# Patient Record
Sex: Female | Born: 1960 | Hispanic: No | Marital: Married | Smoking: Never smoker
Health system: Southern US, Community
[De-identification: ages and names within clinical notes are randomized; demographics above are authoritative.]

## PROBLEM LIST (undated history)

## (undated) DIAGNOSIS — D649 Anemia, unspecified: Secondary | ICD-10-CM

## (undated) DIAGNOSIS — K219 Gastro-esophageal reflux disease without esophagitis: Secondary | ICD-10-CM

## (undated) DIAGNOSIS — B029 Zoster without complications: Secondary | ICD-10-CM

## (undated) DIAGNOSIS — G4730 Sleep apnea, unspecified: Secondary | ICD-10-CM

## (undated) HISTORY — PX: TUBAL LIGATION: SHX77

## (undated) HISTORY — PX: NASAL SINUS SURGERY: SHX719

## (undated) HISTORY — PX: WISDOM TOOTH EXTRACTION: SHX21

---

## 2015-09-23 ENCOUNTER — Other Ambulatory Visit (HOSPITAL_COMMUNITY)
Admission: RE | Admit: 2015-09-23 | Discharge: 2015-09-23 | Disposition: A | Discharge status: Home / Self Care | Payer: Self-pay | Source: Ambulatory Visit | Attending: Family Medicine | Admitting: Family Medicine

## 2015-09-23 ENCOUNTER — Other Ambulatory Visit: Payer: Self-pay

## 2015-09-23 DIAGNOSIS — Z1231 Encounter for screening mammogram for malignant neoplasm of breast: Secondary | ICD-10-CM

## 2015-09-23 DIAGNOSIS — Z124 Encounter for screening for malignant neoplasm of cervix: Secondary | ICD-10-CM | POA: Insufficient documentation

## 2015-10-22 ENCOUNTER — Ambulatory Visit
Admission: RE | Admit: 2015-10-22 | Discharge: 2015-10-22 | Disposition: A | Discharge status: Home / Self Care | Payer: Managed Care, Other (non HMO) | Source: Ambulatory Visit

## 2015-10-22 DIAGNOSIS — Z1231 Encounter for screening mammogram for malignant neoplasm of breast: Secondary | ICD-10-CM

## 2015-12-01 ENCOUNTER — Encounter (HOSPITAL_COMMUNITY): Payer: Self-pay | Admitting: *Deleted

## 2016-01-11 ENCOUNTER — Other Ambulatory Visit (HOSPITAL_COMMUNITY): Payer: Self-pay | Admitting: Obstetrics and Gynecology

## 2016-01-11 NOTE — Anesthesia Preprocedure Evaluation (Signed)
Anesthesia Evaluation  Patient identified by MRN, date of birth, ID band Patient awake    Reviewed: Allergy & Precautions, NPO status , Patient's Chart, lab work & pertinent test results  Airway Mallampati: II  TM Distance: <3 FB Neck ROM: Full    Dental no notable dental hx.    Pulmonary sleep apnea ,    Pulmonary exam normal breath sounds clear to auscultation       Cardiovascular negative cardio ROS Normal cardiovascular exam Rhythm:Regular Rate:Normal     Neuro/Psych negative neurological ROS  negative psych ROS   GI/Hepatic Neg liver ROS, GERD  Medicated,  Endo/Other  negative endocrine ROS  Renal/GU negative Renal ROS  negative genitourinary   Musculoskeletal negative musculoskeletal ROS (+)   Abdominal   Peds negative pediatric ROS (+)  Hematology negative hematology ROS (+)   Anesthesia Other Findings   Reproductive/Obstetrics negative OB ROS                             Anesthesia Physical Anesthesia Plan  ASA: II  Anesthesia Plan: General   Post-op Pain Management:    Induction: Intravenous  Airway Management Planned: Oral ETT  Additional Equipment:   Intra-op Plan:   Post-operative Plan: Extubation in OR  Informed Consent: I have reviewed the patients History and Physical, chart, labs and discussed the procedure including the risks, benefits and alternatives for the proposed anesthesia with the patient or authorized representative who has indicated his/her understanding and acceptance.   Dental advisory given  Plan Discussed with: CRNA and Surgeon  Anesthesia Plan Comments:         Anesthesia Quick Evaluation

## 2016-01-11 NOTE — H&P (Signed)
Chief Complaint(s):   Preop history and physical for 01/12/16   HPI:  General 55 y/o presents history and physical in preparation for hysteroscopy D&C possible polypectomy scheduled for 01/12/2016. she started seeing spotting the first of January. she has spotting once a week. she has had postcoital bleeding since january as well. she denies pelvic pain. she has been postmenopausal since the age of 53.  Her ultrasound performed 10/19/2015 shows a 6.8 cm x 4.3 cm x 3.2 cm uterus. The endometrium is fluid filled with hyperechoic mass on the anterior wall 1.5 cm. no blood flow is noted. she has 3 small fibroids the largest is 1.2 cm. Her ovaries appear normal bilaterally.  Current Medication:  Taking  Multivitamin . Tablet 1 tablet by mouth Once daily     Medication List reviewed and reconciled with the patient   Medical History:   postmenopausal bleeding      Allergies/Intolerance:   N.K.D.A.   Gyn History:   Sexual activity currently sexually active. Periods : postmenopausal. LMP 2012. Birth control BTL. Last pap smear date 09/22/15. Last mammogram date 10/22/15. H/O Abnormal pap smear f/u normal. Denies STD.   OB History:   Number of pregnancies 1. Pregnancy # 1 live birth, vaginal delivery, girl.   Surgical History:   deviated septum 1990     BTL 1988   Hospitalization:   child birth x 1   Family History:   Father: deceased, complications of surgery    Mother: alive, OA    2 brother(s) , 1 sister(s) - healthy. 1daughter(s) .    denies family h/o gyn cancers.  Social History:  General Tobacco use cigarettes: Never smoked, Tobacco history last updated 01/04/2016.  Alcohol: yes, occasionally, wine.  Caffeine: yes, 2+ servings daily, soda.  no Exercise, nothing structured.  Marital Status: married, Separated.  EDUCATION: HS.  OCCUPATION: employed, [employer]astics, 3rd shift.  COMMUNICATION BARRIERS: none.  ROS: CONSTITUTIONAL none" options="no,yes" propid="91" itemid="[id number]"  categoryid="10464" encounterid="[id number]"Fatigue none. none today" options="no,yes" propid="91" itemid="10467" categoryid="10464" encounterid="[id number]"Fever none today.  CARDIOLOGY none" options="no,yes" propid="91" itemid="[id number]" categoryid="10488" encounterid="[id number]"Chest pain none.  RESPIRATORY no" options="no" propid="91" itemid="270013" categoryid="138132" encounterid="[id number]"Shortness of breath no. no" options="no,yes" propid="91" itemid="172745" categoryid="138132" encounterid="[id number]"Cough no.  GASTROENTEROLOGY none" options="no,yes" propid="91" itemid="193447" categoryid="10494" encounterid="[id number]"Appetite change none. no" options="no,yes" propid="91" itemid="193449" categoryid="10494" encounterid="[id number]"Change in bowel habits no.  FEMALE REPRODUCTIVE no" options="no,yes" propid="91" itemid="196298" categoryid="10525" encounterid="[id number]"Breast lumps or discharge no. none" options="no,yes" propid="91" itemid="186083" categoryid="10525" encounterid="[id number]"Breast pain none. none" options="no,yes" propid="91" itemid="138198" categoryid="10525" encounterid="[id number]"Dyspareunia none. no" options="no,yes" propid="91" itemid="202654" categoryid="10525" encounterid="[id number]"Dysuria no. none" options="no,yes" propid="91" itemid="186082" categoryid="10525" encounterid="[id number]"Pelvic pain none. NA" options="no,yes" propid="91" itemid="199173" categoryid="10525" encounterid="[id number]"Regular menses NA. no" options="no,yes" propid="91" itemid="278230" categoryid="10525" encounterid="[id number]"Unusual vaginal discharge no. no" options="no,yes" propid="91" itemid="278942" categoryid="10525" encounterid="[id number]"Vaginal itching no. no" options="no,yes" propid="91" itemid="278837" categoryid="10525" encounterid="[id number]"Vulvar/labial lesion no.  NEUROLOGY none" options="no,yes" propid="91" itemid="193627" categoryid="12512" encounterid="[id number]"Migraines none. none" options="no,yes" propid="91"  itemid="12514" categoryid="12512" encounterid="[id number]"Tingling/numbness none. none" options="no,yes" propid="91" itemid="193467" categoryid="12512" encounterid="[id number]"Visual changes none.  PSYCHOLOGY no" options="" propid="91" itemid="275919" categoryid="10520" encounterid="[id number]"Depression no.  SKIN no" options="no,yes" propid="91" itemid="269383" categoryid="202750" encounterid="[id number]"Rash no. no" options="no,yes" propid="91" itemid="202757" categoryid="202750" encounterid="[id number]"Suspicious lesions no.  ENDOCRINOLOGY none" options="no,yes" propid="91" itemid="202624" categoryid="12508" encounterid="[id number]"Hot flashes none. no unintentional" options="no,yes" propid="91" itemid="193436" categoryid="12508" encounterid="[id number]"Weight gain no unintentional. none" options="no,yes" propid="91" itemid="138164" categoryid="12508" encounterid="[id number]"Weight loss none.  HEMATOLOGY/LYMPH no" options="no,yes" propid="91" itemid="193454" categoryid="138157" encounterid="[id number]"Anemia no.    Objective: Vitals:  Wt 215, Wt change -5 lb, Pulse sitting 62, BP sitting 133/71  Past Results:  Examination:  General Examination McCoy,Tiffany 01/04/2016 09:44:16 AM &gt; , for pelvic exam only" categoryPropId="[id number]" examid="[id number]"CHAPERONE PRESENT McCoy,Tiffany 01/04/2016 09:44:16  AM > , for pelvic exam only.  Physical Examination: GENERAL in NAD, pleasant"Patient appears in NAD, pleasant. well developed"Build: well developed. overweight"General Appearance: overweight. african-american"Race: african-american.  NECK unremarkable, no lymphadenopathy"Cervical lymph nodes: unremarkable, no lymphadenopathy. normal"ROM: normal. no thyromegaly, non tender"Thyroid: no thyromegaly, non tender.  LUNGS clear to auscultation"Breath sounds: clear to auscultation. no"Dyspnea: no.  HEART none"Murmurs: none. normal"Rate: normal. regular"Rhythm: regular.  ABDOMEN no masses,tenderness,organomegaly, no CVAT"General:  no masses,tenderness,organomegaly, no CVAT.  FEMALE GENITOURINARY no mass, non tender"Adnexa: no mass, non tender. normal, no lesions"Anus/perineum: normal, no lesions. normal appearance , no lesions/discharge/bleeding,, good pelvic support , external os normal "Cervix/ cuff: normal appearance , no lesions/discharge/bleeding,, good pelvic support , external os normal . normal, no lesions, no skin discoloration, no lymphadenopathy"External genitalia: normal, no lesions, no skin discoloration, no lymphadenopathy. deferred"Rectum: deferred. normal external meatus"Urethra: normal external meatus. normal size/shape/consistency, freely mobile, non tender"Uterus: normal size/shape/consistency, freely mobile, non tender. pink/moist mucosa, no lesions, no abnormal discharge, odorless"Vagina: pink/moist mucosa, no lesions, no abnormal discharge, odorless. normal, no lesions, no skin discoloration, non tender"Vulva: normal, no lesions, no skin discoloration, non tender.  EXTREMITIES FROM of all extremities"Extremities FROM of all extremities.  NEUROLOGICAL normal"Gait: normal. alert and oriented x 3"Orientation: alert and oriented x 3.    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 Marland Kitchen rb/a of surgery discussed with the patient including but not limited to infection bleeding perforation of the uterus with the need for further surgery. pt voiced understanding and desires to proceed.  Cervical stenosis (uterine cervix)  Notes: pt to place cytotec 200 mcg per vagina 12 hours and 6 hours prior to procedure.. medication given to pt in the office.  Endometrial mass  Notes: hystereoscopy D&C possible polypectomy. Marland Kitchen rb/a of surgery discussed with the patient including but  not limited to infection bleeding perforation of the uterus with the need for further surgery. pt voiced understanding and desires to proceed

## 2016-01-12 ENCOUNTER — Encounter (HOSPITAL_COMMUNITY): Payer: Self-pay | Admitting: *Deleted

## 2016-01-12 ENCOUNTER — Encounter (HOSPITAL_COMMUNITY)
Admission: RE | Disposition: A | Discharge status: Home / Self Care | Payer: Self-pay | Source: Ambulatory Visit | Attending: Obstetrics and Gynecology

## 2016-01-12 ENCOUNTER — Ambulatory Visit (HOSPITAL_COMMUNITY)
Admission: RE | Admit: 2016-01-12 | Discharge: 2016-01-12 | Disposition: A | Discharge status: Home / Self Care | Payer: 59 | Source: Ambulatory Visit | Attending: Obstetrics and Gynecology | Admitting: Obstetrics and Gynecology

## 2016-01-12 ENCOUNTER — Ambulatory Visit (HOSPITAL_COMMUNITY): Payer: 59 | Admitting: Anesthesiology

## 2016-01-12 DIAGNOSIS — Z79899 Other long term (current) drug therapy: Secondary | ICD-10-CM | POA: Insufficient documentation

## 2016-01-12 DIAGNOSIS — N8502 Endometrial intraepithelial neoplasia [EIN]: Secondary | ICD-10-CM | POA: Diagnosis not present

## 2016-01-12 DIAGNOSIS — Z8261 Family history of arthritis: Secondary | ICD-10-CM | POA: Insufficient documentation

## 2016-01-12 DIAGNOSIS — N882 Stricture and stenosis of cervix uteri: Secondary | ICD-10-CM | POA: Diagnosis present

## 2016-01-12 DIAGNOSIS — N9489 Other specified conditions associated with female genital organs and menstrual cycle: Secondary | ICD-10-CM | POA: Diagnosis present

## 2016-01-12 DIAGNOSIS — N950 Postmenopausal bleeding: Secondary | ICD-10-CM | POA: Diagnosis present

## 2016-01-12 HISTORY — PX: DILATATION & CURETTAGE/HYSTEROSCOPY WITH MYOSURE: SHX6511

## 2016-01-12 HISTORY — DX: Gastro-esophageal reflux disease without esophagitis: K21.9

## 2016-01-12 HISTORY — DX: Anemia, unspecified: D64.9

## 2016-01-12 HISTORY — DX: Zoster without complications: B02.9

## 2016-01-12 HISTORY — DX: Sleep apnea, unspecified: G47.30

## 2016-01-12 LAB — CBC
HCT: 37.7 % (ref 36.0–46.0)
HEMOGLOBIN: 12.4 g/dL (ref 12.0–15.0)
MCH: 25.5 pg — AB (ref 26.0–34.0)
MCHC: 32.9 g/dL (ref 30.0–36.0)
MCV: 77.6 fL — AB (ref 78.0–100.0)
PLATELETS: 305 10*3/uL (ref 150–400)
RBC: 4.86 MIL/uL (ref 3.87–5.11)
RDW: 14.7 % (ref 11.5–15.5)
WBC: 4.5 10*3/uL (ref 4.0–10.5)

## 2016-01-12 LAB — TYPE AND SCREEN
ABO/RH(D): A POS
Antibody Screen: NEGATIVE

## 2016-01-12 LAB — ABO/RH: ABO/RH(D): A POS

## 2016-01-12 SURGERY — DILATATION & CURETTAGE/HYSTEROSCOPY WITH MYOSURE
Anesthesia: General | Site: Vagina

## 2016-01-12 MED ORDER — ROCURONIUM BROMIDE 100 MG/10ML IV SOLN
INTRAVENOUS | Status: AC
  Filled 2016-01-12: qty 1

## 2016-01-12 MED ORDER — FENTANYL CITRATE (PF) 100 MCG/2ML IJ SOLN
INTRAMUSCULAR | Status: DC | PRN
  Administered 2016-01-12: 50 ug via INTRAVENOUS

## 2016-01-12 MED ORDER — PROPOFOL 10 MG/ML IV BOLUS
INTRAVENOUS | Status: DC | PRN
  Administered 2016-01-12: 170 mg via INTRAVENOUS

## 2016-01-12 MED ORDER — KETOROLAC TROMETHAMINE 30 MG/ML IJ SOLN
INTRAMUSCULAR | Status: AC
  Filled 2016-01-12: qty 1

## 2016-01-12 MED ORDER — DEXAMETHASONE SODIUM PHOSPHATE 10 MG/ML IJ SOLN
INTRAMUSCULAR | Status: DC | PRN
  Administered 2016-01-12: 4 mg via INTRAVENOUS

## 2016-01-12 MED ORDER — ONDANSETRON HCL 4 MG/2ML IJ SOLN
INTRAMUSCULAR | Status: DC | PRN
  Administered 2016-01-12: 4 mg via INTRAVENOUS

## 2016-01-12 MED ORDER — KETOROLAC TROMETHAMINE 30 MG/ML IJ SOLN
INTRAMUSCULAR | Status: DC | PRN
  Administered 2016-01-12: 30 mg via INTRAVENOUS

## 2016-01-12 MED ORDER — PROPOFOL 10 MG/ML IV BOLUS
INTRAVENOUS | Status: AC
  Filled 2016-01-12: qty 20

## 2016-01-12 MED ORDER — SCOPOLAMINE 1 MG/3DAYS TD PT72
MEDICATED_PATCH | TRANSDERMAL | Status: AC
  Administered 2016-01-12: 1.5 mg via TRANSDERMAL
  Filled 2016-01-12: qty 1

## 2016-01-12 MED ORDER — EPHEDRINE SULFATE 50 MG/ML IJ SOLN
INTRAMUSCULAR | Status: DC | PRN
  Administered 2016-01-12: 5 mg via INTRAVENOUS

## 2016-01-12 MED ORDER — DEXAMETHASONE SODIUM PHOSPHATE 4 MG/ML IJ SOLN
INTRAMUSCULAR | Status: AC
  Filled 2016-01-12: qty 1

## 2016-01-12 MED ORDER — EPHEDRINE 5 MG/ML INJ
INTRAVENOUS | Status: AC
  Filled 2016-01-12: qty 10

## 2016-01-12 MED ORDER — LACTATED RINGERS IV SOLN
INTRAVENOUS | Status: DC
  Administered 2016-01-12 (×2): via INTRAVENOUS

## 2016-01-12 MED ORDER — METOCLOPRAMIDE HCL 5 MG/ML IJ SOLN: 10.0000 mg | Freq: Once | INTRAMUSCULAR | Status: DC | PRN

## 2016-01-12 MED ORDER — HYDROMORPHONE HCL 1 MG/ML IJ SOLN: 0.2500 mg | INTRAMUSCULAR | Status: DC | PRN

## 2016-01-12 MED ORDER — IBUPROFEN 600 MG PO TABS: ORAL_TABLET | ORAL | Status: AC

## 2016-01-12 MED ORDER — ONDANSETRON HCL 4 MG/2ML IJ SOLN
INTRAMUSCULAR | Status: AC
  Filled 2016-01-12: qty 2

## 2016-01-12 MED ORDER — LIDOCAINE HCL (CARDIAC) 20 MG/ML IV SOLN
INTRAVENOUS | Status: AC
  Filled 2016-01-12: qty 5

## 2016-01-12 MED ORDER — LIDOCAINE HCL (CARDIAC) 20 MG/ML IV SOLN
INTRAVENOUS | Status: DC | PRN
  Administered 2016-01-12: 100 mg via INTRAVENOUS

## 2016-01-12 MED ORDER — SCOPOLAMINE 1 MG/3DAYS TD PT72
1.0000 | MEDICATED_PATCH | Freq: Once | TRANSDERMAL | Status: DC
  Administered 2016-01-12: 1.5 mg via TRANSDERMAL

## 2016-01-12 MED ORDER — BUPIVACAINE HCL (PF) 0.25 % IJ SOLN
INTRAMUSCULAR | Status: DC | PRN
  Administered 2016-01-12: 20 mL

## 2016-01-12 MED ORDER — MIDAZOLAM HCL 2 MG/2ML IJ SOLN
INTRAMUSCULAR | Status: DC | PRN
  Administered 2016-01-12: 2 mg via INTRAVENOUS

## 2016-01-12 MED ORDER — MIDAZOLAM HCL 2 MG/2ML IJ SOLN
INTRAMUSCULAR | Status: AC
  Filled 2016-01-12: qty 2

## 2016-01-12 MED ORDER — OXYCODONE-ACETAMINOPHEN 5-325 MG PO TABS: 1.0000 | ORAL_TABLET | ORAL | Status: DC | PRN

## 2016-01-12 MED ORDER — BUPIVACAINE HCL (PF) 0.25 % IJ SOLN
INTRAMUSCULAR | Status: AC
  Filled 2016-01-12: qty 30

## 2016-01-12 MED ORDER — FENTANYL CITRATE (PF) 250 MCG/5ML IJ SOLN
INTRAMUSCULAR | Status: AC
  Filled 2016-01-12: qty 5

## 2016-01-12 SURGICAL SUPPLY — 23 items
CANISTER SUCT 3000ML (MISCELLANEOUS) ×3 IMPLANT
CATH ROBINSON RED A/P 16FR (CATHETERS) ×3 IMPLANT
CLOTH BEACON ORANGE TIMEOUT ST (SAFETY) ×3 IMPLANT
CONTAINER PREFILL 10% NBF 60ML (FORM) ×3 IMPLANT
DEVICE MYOSURE LITE (MISCELLANEOUS) ×3 IMPLANT
DEVICE MYOSURE REACH (MISCELLANEOUS) IMPLANT
DILATOR CANAL MILEX (MISCELLANEOUS) ×3 IMPLANT
ELECT REM PT RETURN 9FT ADLT (ELECTROSURGICAL) ×3
ELECTRODE REM PT RTRN 9FT ADLT (ELECTROSURGICAL) ×1 IMPLANT
FILTER ARTHROSCOPY CONVERTOR (FILTER) ×3 IMPLANT
GLOVE BIOGEL M 6.5 STRL (GLOVE) ×6 IMPLANT
GLOVE BIOGEL PI IND STRL 6.5 (GLOVE) ×1 IMPLANT
GLOVE BIOGEL PI IND STRL 7.0 (GLOVE) ×1 IMPLANT
GLOVE BIOGEL PI INDICATOR 6.5 (GLOVE) ×2
GLOVE BIOGEL PI INDICATOR 7.0 (GLOVE) ×2
GOWN STRL REUS W/TWL LRG LVL3 (GOWN DISPOSABLE) ×6 IMPLANT
PACK VAGINAL MINOR WOMEN LF (CUSTOM PROCEDURE TRAY) ×3 IMPLANT
PAD OB MATERNITY 4.3X12.25 (PERSONAL CARE ITEMS) ×3 IMPLANT
SEAL ROD LENS SCOPE MYOSURE (ABLATOR) ×3 IMPLANT
TOWEL OR 17X24 6PK STRL BLUE (TOWEL DISPOSABLE) ×6 IMPLANT
TUBING AQUILEX INFLOW (TUBING) ×3 IMPLANT
TUBING AQUILEX OUTFLOW (TUBING) ×3 IMPLANT
WATER STERILE IRR 1000ML POUR (IV SOLUTION) ×3 IMPLANT

## 2016-01-12 NOTE — H&P (Signed)
Date of Initial H&P: 01/11/2016  History reviewed, patient examined, no change in status, stable for surgery.

## 2016-01-12 NOTE — Transfer of Care (Signed)
Immediate Anesthesia Transfer of Care Note  Patient: Ebony Nichols  Procedure(s) Performed: Procedure(s): DILATATION & CURETTAGE/HYSTEROSCOPY WITH MYOSURE POLYPECTOMY (N/A)  Patient Location: PACU  Anesthesia Type:General  Level of Consciousness: awake, alert  and oriented  Airway & Oxygen Therapy: Patient Spontanous Breathing and Patient connected to nasal cannula oxygen  Post-op Assessment: Report given to RN, Post -op Vital signs reviewed and stable and Patient moving all extremities  Post vital signs: Reviewed and stable  Last Vitals:  Filed Vitals:   01/12/16 0618  BP: 116/58  Pulse: 74  Temp: 36.8 C  Resp: 18    Last Pain: There were no vitals filed for this visit.    Patients Stated Pain Goal: 4 ([id number])  Complications: No apparent anesthesia complications

## 2016-01-12 NOTE — Anesthesia Procedure Notes (Signed)
Procedure Name: LMA Insertion Date/Time: 01/12/2016 7:29 AM Performed by: Hewitt Blade Pre-anesthesia Checklist: Patient identified, Emergency Drugs available, Suction available and Patient being monitored Patient Re-evaluated:Patient Re-evaluated prior to inductionOxygen Delivery Method: Circle system utilized Preoxygenation: Pre-oxygenation with 100% oxygen Intubation Type: IV induction LMA: LMA inserted LMA Size: 4.0 Placement Confirmation: positive ETCO2 and breath sounds checked- equal and bilateral Tube secured with: Tape Dental Injury: Teeth and Oropharynx as per pre-operative assessment

## 2016-01-12 NOTE — Discharge Instructions (Signed)

## 2016-01-12 NOTE — Anesthesia Postprocedure Evaluation (Signed)
Anesthesia Post Note  Patient: Ebony Nichols  Procedure(s) Performed: Procedure(s) (LRB): DILATATION & CURETTAGE/HYSTEROSCOPY WITH MYOSURE POLYPECTOMY (N/A)  Patient location during evaluation: PACU Anesthesia Type: General Level of consciousness: awake and alert Pain management: pain level controlled Vital Signs Assessment: post-procedure vital signs reviewed and stable Respiratory status: spontaneous breathing, nonlabored ventilation, respiratory function stable and patient connected to nasal cannula oxygen Cardiovascular status: blood pressure returned to baseline and stable Postop Assessment: no signs of nausea or vomiting Anesthetic complications: no    Last Vitals:  Filed Vitals:   01/12/16 0618  BP: 116/58  Pulse: 74  Temp: 36.8 C  Resp: 18    Last Pain: There were no vitals filed for this visit.               Aubriana Ravelo S

## 2016-01-12 NOTE — Op Note (Signed)
01/12/2016  8:26 AM  PATIENT:  Ebony Nichols  55 y.o. female  PRE-OPERATIVE DIAGNOSIS:  Endometrial Mass POST MENOPAUSAL BLEEDING   POST-OPERATIVE DIAGNOSIS:  Endometrial Mass POST MENOPAUSAL BLEEDING   PROCEDURE:  Procedure(s): DILATATION & CURETTAGE/HYSTEROSCOPY WITH MYOSURE POLYPECTOMY (N/A)  SURGEON:  Surgeon(s) and Role:    * Christophe Louis, MD - Primary  PHYSICIAN ASSISTANT: None  ASSISTANTS: none   ANESTHESIA:   general  EBL:  5 cc  Total I/O In: 1000 [I.V.:1000] Out: 5 [Blood:5]  BLOOD ADMINISTERED:none  DRAINS: none   LOCAL MEDICATIONS USED:  MARCAINE     SPECIMEN:  Source of Specimen:  endometrial currettings and possible polyp  DISPOSITION OF SPECIMEN:  PATHOLOGY  COUNTS:  YES  TOURNIQUET:  * No tourniquets in log *  DICTATION: .Dragon Dictation  PLAN OF CARE: Discharge to home after PACU  PATIENT DISPOSITION:  PACU - hemodynamically stable.   Delay start of Pharmacological VTE agent (>24hrs) due to surgical blood loss or risk of bleeding: not applicable  Findings: stenotic cervix... Small polypoid tissue in endometrial cavity.. The majority of the endometrium appeared inactive.  Procedure: Patient was taken to the operating room where she was placed under general anesthesia. She was placed in the dorsal lithotomy position. She was prepped and draped in the usual sterile fashion. Time out was performed.   A speculum was placed into the vaginal vault. The anterior lip of the cervix was grasped with a single-tooth tenaculum. Quarter percent Marcaine (10cc)  was injected at the 4 and 8:00 positions of the cervix. The cervix was then sounded to 7 cm. The cervix was dilated to approximately 6 mm. Myosure hysteroscope was inserted. The findings noted above. Myosure lite blade was inserted and the polypoid tissue was removed without difficulty.Hervey Ard curet was introduced and endometrial corrected curettings were obtained. The hysteroscope was then reinserted. There  was no evidence of endometrial polyps or masses with reinsertion of the hysteroscope. There was no evidence of perforation. Hysteroscope was then removed. The single-tooth tenaculum was removed from the anterior lip of the cervix. Excellent hemostasis was noted  . The speculum was removed from the patient's vagina. She was awakened from anesthesia taken to the recovery  room awake and in stable condition. Sponge lap and needle counts were correct x2.

## 2016-01-13 ENCOUNTER — Encounter (HOSPITAL_COMMUNITY): Payer: Self-pay | Admitting: Obstetrics and Gynecology

## 2016-02-16 ENCOUNTER — Encounter: Payer: Self-pay | Admitting: Gynecologic Oncology

## 2016-02-16 ENCOUNTER — Ambulatory Visit: Payer: 59 | Attending: Gynecologic Oncology | Admitting: Gynecologic Oncology

## 2016-02-16 VITALS — BP 128/85 | HR 89 | Temp 97.9°F | Resp 19 | Ht 65.0 in | Wt 212.0 lb

## 2016-02-16 DIAGNOSIS — Z8619 Personal history of other infectious and parasitic diseases: Secondary | ICD-10-CM | POA: Insufficient documentation

## 2016-02-16 DIAGNOSIS — K219 Gastro-esophageal reflux disease without esophagitis: Secondary | ICD-10-CM | POA: Diagnosis not present

## 2016-02-16 DIAGNOSIS — Z90710 Acquired absence of both cervix and uterus: Secondary | ICD-10-CM | POA: Insufficient documentation

## 2016-02-16 DIAGNOSIS — Z90722 Acquired absence of ovaries, bilateral: Secondary | ICD-10-CM | POA: Diagnosis not present

## 2016-02-16 DIAGNOSIS — N8502 Endometrial intraepithelial neoplasia [EIN]: Secondary | ICD-10-CM | POA: Insufficient documentation

## 2016-02-16 DIAGNOSIS — D649 Anemia, unspecified: Secondary | ICD-10-CM | POA: Diagnosis not present

## 2016-02-16 DIAGNOSIS — G4730 Sleep apnea, unspecified: Secondary | ICD-10-CM | POA: Insufficient documentation

## 2016-02-16 NOTE — Patient Instructions (Addendum)
Preparing for your Surgery  Plan for surgery including a robotic assisted total hysterectomy, bilateral salpingo-oophorectomy, possible lymphadenectomy.  Call our office when you have decided on a time when you would like to have surgery.  Pre-operative Testing -You will receive a phone call from presurgical testing at [facility] to arrange for a pre-operative testing appointment before your surgery.  This appointment normally occurs one to two weeks before your scheduled surgery.   -Bring your insurance card, copy of an advanced directive if applicable, medication list  -At that visit, you will be asked to sign a consent for a possible blood transfusion in case a transfusion becomes necessary during surgery.  The need for a blood transfusion is rare but having consent is a necessary part of your care.     -You should not be taking blood thinners or aspirin at least ten days prior to surgery unless instructed by your surgeon.  Day Before Surgery at Man will be asked to take in a light diet the day before surgery.  Avoid carbonated beverages.  You will be advised to have nothing to eat or drink after midnight the evening before.     Eat a light diet the day before surgery.  Examples including soups, broths, toast, yogurt, mashed potatoes.  Things to avoid include carbonated beverages (fizzy beverages), raw fruits and raw vegetables, or beans.    If your bowels are filled with gas, your surgeon will have difficulty visualizing your pelvic organs which increases your surgical risks.  Your role in recovery Your role is to become active as soon as directed by your doctor, while still giving yourself time to heal.  Rest when you feel tired. You will be asked to do the following in order to speed your recovery:  - Cough and breathe deeply. This helps toclear and expand your lungs and can prevent pneumonia. You may be given a spirometer to practice deep breathing. A staff  member will show you how to use the spirometer. - Do mild physical activity. Walking or moving your legs help your circulation and body functions return to normal. A staff member will help you when you try to walk and will provide you with simple exercises. Do not try to get up or walk alone the first time. - Actively manage your pain. Managing your pain lets you move in comfort. We will ask you to rate your pain on a scale of zero to 10. It is your responsibility to tell your doctor or nurse where and how much you hurt so your pain can be treated.  Special Considerations -If you are diabetic, you may be placed on insulin after surgery to have closer control over your blood sugars to promote healing and recovery.  This does not mean that you will be discharged on insulin.  If applicable, your oral antidiabetics will be resumed when you are tolerating a solid diet.  -Your final pathology results from surgery should be available by the Friday after surgery and the results will be relayed to you when available.  Blood Transfusion Information WHAT IS A BLOOD TRANSFUSION? A transfusion is the replacement of blood or some of its parts. Blood is made up of multiple cells which provide different functions.  Red blood cells carry oxygen and are used for blood loss replacement.  White blood cells fight against infection.  Platelets control bleeding.  Plasma helps clot blood.  Other blood products are available for specialized needs, such as hemophilia or other  clotting disorders. BEFORE THE TRANSFUSION  Who gives blood for transfusions?   You may be able to donate blood to be used at a later date on yourself (autologous donation).  Relatives can be asked to donate blood. This is generally not any safer than if you have received blood from a stranger. The same precautions are taken to ensure safety when a relative's blood is donated.  Healthy volunteers who are fully evaluated to make sure their  blood is safe. This is blood bank blood. Transfusion therapy is the safest it has ever been in the practice of medicine. Before blood is taken from a donor, a complete history is taken to make sure that person has no history of diseases nor engages in risky social behavior (examples are intravenous drug use or sexual activity with multiple partners). The donor's travel history is screened to minimize risk of transmitting infections, such as malaria. The donated blood is tested for signs of infectious diseases, such as HIV and hepatitis. The blood is then tested to be sure it is compatible with you in order to minimize the chance of a transfusion reaction. If you or a relative donates blood, this is often done in anticipation of surgery and is not appropriate for emergency situations. It takes many days to process the donated blood. RISKS AND COMPLICATIONS Although transfusion therapy is very safe and saves many lives, the main dangers of transfusion include:   Getting an infectious disease.  Developing a transfusion reaction. This is an allergic reaction to something in the blood you were given. Every precaution is taken to prevent this. The decision to have a blood transfusion has been considered carefully by your caregiver before blood is given. Blood is not given unless the benefits outweigh the risks.   Hysterectomy Information  Ideally you will be given 4 weeks off of work. However, we would be willing to you go back after 2 weeks if her postoperative visit is satisfactory and are doing well after surgery.    A hysterectomy is a surgery in which your uterus is removed. This surgery may be done to treat various medical problems. After the surgery, you will no longer have menstrual periods. The surgery will also make you unable to become pregnant (sterile). The fallopian tubes and ovaries can be removed (bilateral salpingo-oophorectomy) during this surgery as well.  REASONS FOR A  HYSTERECTOMY  Persistent, abnormal bleeding.  Lasting (chronic) pelvic pain or infection.  The lining of the uterus (endometrium) starts growing outside the uterus (endometriosis).  The endometrium starts growing in the muscle of the uterus (adenomyosis).  The uterus falls down into the vagina (pelvic organ prolapse).  Noncancerous growths in the uterus (uterine fibroids) that cause symptoms.  Precancerous cells.  Cervical cancer or uterine cancer. TYPES OF HYSTERECTOMIES  Supracervical hysterectomy--In this type, the top part of the uterus is removed, but not the cervix.  Total hysterectomy--The uterus and cervix are removed.  Radical hysterectomy--The uterus, the cervix, and the fibrous tissue that holds the uterus in place in the pelvis (parametrium) are removed. WAYS A HYSTERECTOMY CAN BE PERFORMED  Abdominal hysterectomy--A large surgical cut (incision) is made in the abdomen. The uterus is removed through this incision.  Vaginal hysterectomy--An incision is made in the vagina. The uterus is removed through this incision. There are no abdominal incisions.  Conventional laparoscopic hysterectomy--Three or four small incisions are made in the abdomen. A thin, lighted tube with a camera (laparoscope) is inserted into one of the incisions. Other  tools are put through the other incisions. The uterus is cut into small pieces. The small pieces are removed through the incisions, or they are removed through the vagina.  Laparoscopically assisted vaginal hysterectomy (LAVH)--Three or four small incisions are made in the abdomen. Part of the surgery is performed laparoscopically and part vaginally. The uterus is removed through the vagina.  Robot-assisted laparoscopic hysterectomy--A laparoscope and other tools are inserted into 3 or 4 small incisions in the abdomen. A computer-controlled device is used to give the surgeon a 3D image and to help control the surgical instruments. This  allows for more precise movements of surgical instruments. The uterus is cut into small pieces and removed through the incisions or removed through the vagina. RISKS AND COMPLICATIONS  Possible complications associated with this procedure include:  Bleeding and risk of blood transfusion. Tell your health care provider if you do not want to receive any blood products.  Blood clots in the legs or lung.  Infection.  Injury to surrounding organs.  Problems or side effects related to anesthesia.  Conversion to an abdominal hysterectomy from one of the other techniques. WHAT TO EXPECT AFTER A HYSTERECTOMY  You will be given pain medicine.  You will need to have someone with you for the first 3-5 days after you go home.  You will need to follow up with your surgeon in 2-4 weeks after surgery to evaluate your progress.  You may have early menopause symptoms such as hot flashes, night sweats, and insomnia.  If you had a hysterectomy for a problem that was not cancer or not a condition that could lead to cancer, then you no longer need Pap tests. However, even if you no longer need a Pap test, a regular exam is a good idea to make sure no other problems are starting.   This information is not intended to replace advice given to you by your health care provider. Make sure you discuss any questions you have with your health care provider.   Document Released: 01/31/2001 Document Revised: 05/28/2013 Document Reviewed: 04/14/2013 Elsevier Interactive Patient Education [employer].

## 2016-02-16 NOTE — Progress Notes (Signed)
Consult Note: Gyn-Onc  Ebony Nichols 55 y.o. female  CC:  Chief Complaint  Patient presents with  . Follow-up    HPI: Patient is seen today in consultation at the request of Dr. Christophe Louis. Primary physician Dr. Izora Gala.  Patient is a very pleasant 55 year old postmenopausal patient who began experiencing some irregular bleeding starting in January. She underwent a hysteroscopy D&C with polypectomy with demise sure on 01/12/2016. Her preoperative ultrasound revealed a 6.8 x 4.3 x 3.2 cm uterus. The endometrium was filled with a hypoechoic mass on the anterior wall measuring 1.5 cm. No blood flow was identified. She had 3 small fibroids with the largest being 1.2 cm. The ovaries appeared normal bilaterally. 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 It is for this reason that she is referred to [place] today. The patient is otherwise in her usual state of health and feels quite well. Her biggest concern regarding the surgery that Dr. Landry Mellow has recommended for her is the amount of time that she will need to take off work. She's only been at her current employment for a year and does not have another week of vacation for a year.  There is no cancer history in her family. Her daughter is 55 years old and is on hemodialysis for intrinsic kidney disease.  Review of Systems  Constitutional: Denies fever. Skin: No rash Cardiovascular: No chest pain, shortness of breath, or edema  Pulmonary: No cough  Gastro Intestinal: No nausea, vomiting, constipation, or diarrhea reported. No change in bowel movement.  Genitourinary: No frequency, urgency, or dysuria.  Denies vaginal bleeding and discharge since her procedure.  Musculoskeletal: No joint swelling or pain.  Neurologic: No weakness Psychology: No complaints   Current Meds:  Outpatient Encounter Prescriptions as of 02/16/2016  Medication Sig  . Multiple Vitamins-Calcium (ONE-A-DAY WOMENS PO) Take 1 tablet  by mouth daily.  Marland Kitchen ibuprofen (ADVIL,MOTRIN) 600 MG tablet 1 po every 6 hours as needed for (Patient not taking: Reported on 02/16/2016)  . omeprazole (PRILOSEC) 20 MG capsule Take 20 mg by mouth daily. Reported on 02/16/2016  . oxyCODONE-acetaminophen (ROXICET) 5-325 MG tablet Take 1-2 tablets by mouth every 4 (four) hours as needed for severe pain. (Patient not taking: Reported on 02/16/2016)   No facility-administered encounter medications on file as of 02/16/2016.    Allergy: No Known Allergies  Social Hx:   Social History   Social History  . Marital Status: Unknown    Spouse Name: N/A  . Number of Children: N/A  . Years of Education: N/A   Occupational History  . Not on file.   Social History Main Topics  . Smoking status: Never Smoker   . Smokeless tobacco: Never Used  . Alcohol Use: No  . Drug Use: No  . Sexual Activity: Yes    Birth Control/ Protection: Post-menopausal   Other Topics Concern  . Not on file   Social History Narrative    Past Surgical Hx:  Past Surgical History  Procedure Laterality Date  . Nasal sinus surgery    . Tubal ligation    . Wisdom tooth extraction    . Dilatation & curettage/hysteroscopy with myosure N/A 01/12/2016    Procedure: DILATATION & CURETTAGE/HYSTEROSCOPY WITH MYOSURE POLYPECTOMY;  Surgeon: Christophe Louis, MD;  Location: [facility];  Service: Gynecology;  Laterality: N/A;    Past Medical Hx:  Past Medical History  Diagnosis Date  . SVD (spontaneous vaginal delivery)     x 1  . Sleep  apnea     does not use cpap  . GERD (gastroesophageal reflux disease)   . Shingles     history  . Anemia     history    Oncology Hx:   No history exists.    Family Hx: History reviewed. No pertinent family history.  Vitals:  Blood pressure 128/85, pulse 89, temperature 97.9 F (36.6 C), temperature source Oral, resp. rate 19, height 5\' 5"  (1.651 m), weight 212 lb (96.163 kg), SpO2 100 %.  Physical Exam: Well-nourished well-developed female  in no acute distress.  Neck: Supple, no lymphadenopathy, no thyromegaly.  Lungs: Clear to auscultation bilaterally.  Cardiac: Regular rate and rhythm.  Abdomen: Soft, nontender, nondistended. There are no palpable masses. There is no hepatomegaly.  Groins: No lymphadenopathy.  Extremity: No edema.  Pelvic: Normal female genitalia. Vagina slightly atrophic. The cervix is visualized. There's no visible lesions. There's a physiologic discharge. Bimanual examination the uterus is of normal size shape and consistency. There are no adnexal masses.  Assessment/Plan:  55 year old gravida 1 para 1 with Copp with hyperplasia with atypia. We discussed proceeding with definitive surgery including a total hysterectomy bilateral salpingo-oophorectomy and staging based on frozen section findings. I believe this can be done in a minimally invasive fashion. The patient is amenable and wishes to proceed with surgery. She was provided information regarding her diagnosis and the treatment plan to take to her employer. She does walk and is on her feet much during the day but does not do any heavy lifting procedure pulling. I discussed with her that the every week postop visit if she was doing well we would feel comfortable letting her go back to work but she states that her employer will most likely be willing to work with her. She will speak to her employer tomorrow and then contact us so we can schedule her surgery appropriately.  She works third shift at work and would be able to come in for preop visit and Precare visit without interrupting her work schedule.   We appreciate the opportunity to partner in the care of this very pleasant patient. Tai Syfert A., MD 02/16/2016, 12:10 PM

## 2016-02-18 ENCOUNTER — Ambulatory Visit: Payer: 59 | Admitting: Gynecologic Oncology

## 2016-02-18 ENCOUNTER — Telehealth: Payer: Self-pay | Admitting: Gynecologic Oncology

## 2016-02-18 NOTE — Telephone Encounter (Signed)
Returned call to patient.  Left message asking her to please call the office. 

## 2016-03-15 ENCOUNTER — Telehealth: Payer: Self-pay | Admitting: Gynecologic Oncology

## 2016-03-15 NOTE — Telephone Encounter (Signed)
Attempted to call patient and let her know that pre-surgical testing is trying to reach her to set her up for pre-surgical testing.

## 2016-03-16 NOTE — Patient Instructions (Addendum)
Ebony Nichols  03/16/2016   Your procedure is scheduled on: 03/21/2016    Report to [facility]  Entrance take [place]  elevators to 3rd floor to  [place] at    [place] AM.  Call this number if you have problems the morning of surgery [phone]   Remember: ONLY 1 PERSON MAY GO WITH YOU TO SHORT STAY TO GET  READY MORNING OF [place].   Do not eat food or drink liquids :After Midnight the night before surgery!               Eat a light diet the day before surgery.  Examples include: soups, toast, broth, yogurt and mashed potatoes.  Things to avoid include raw fruits, vegetables and beans.       Take these medicines the morning of surgery with A SIP OF WATER: may take Zantac if needed                                You may not have any metal on your body including hair pins and              piercings  Do not wear jewelry, make-up, lotions, powders or perfumes, deodorant             Do not wear nail polish.  Do not shave  48 hours prior to surgery.                 Do not bring valuables to the hospital. [place].  Contacts, dentures or bridgework may not be worn into surgery.  Leave suitcase in the car. After surgery it may be brought to your room.       Special Instructions: coughing and deep breathing exercises, leg exercises               Please read over the following fact sheets you were given: _____________________________________________________________________             [facility] - Preparing for Surgery Before surgery, you can play an important role.  Because skin is not sterile, your skin needs to be as free of germs as possible.  You can reduce the number of germs on your skin by washing with CHG (chlorahexidine gluconate) soap before surgery.  CHG is an antiseptic cleaner which kills germs and bonds with the skin to continue killing germs even after washing. Please DO NOT use  if you have an allergy to CHG or antibacterial soaps.  If your skin becomes reddened/irritated stop using the CHG and inform your nurse when you arrive at Short Stay. Do not shave (including legs and underarms) for at least 48 hours prior to the first CHG shower.  You may shave your face/neck. Please follow these instructions carefully:  1.  Shower with CHG Soap the night before surgery and the  morning of Surgery.  2.  If you choose to wash your hair, wash your hair first as usual with your  normal  shampoo.  3.  After you shampoo, rinse your hair and body thoroughly to remove the  shampoo.  4.  Use CHG as you would any other liquid soap.  You can apply chg directly  to the skin and wash                       Gently with a scrungie or clean washcloth.  5.  Apply the CHG Soap to your body ONLY FROM THE NECK DOWN.   Do not use on face/ open                           Wound or open sores. Avoid contact with eyes, ears mouth and genitals (private parts).                       Wash face,  Genitals (private parts) with your normal soap.             6.  Wash thoroughly, paying special attention to the area where your surgery  will be performed.  7.  Thoroughly rinse your body with warm water from the neck down.  8.  DO NOT shower/wash with your normal soap after using and rinsing off  the CHG Soap.                9.  Pat yourself dry with a clean towel.            10.  Wear clean pajamas.            11.  Place clean sheets on your bed the night of your first shower and do not  sleep with pets. Day of Surgery : Do not apply any lotions/deodorants the morning of surgery.  Please wear clean clothes to the hospital/surgery center.  FAILURE TO FOLLOW THESE INSTRUCTIONS MAY RESULT IN THE CANCELLATION OF YOUR SURGERY PATIENT SIGNATURE_________________________________  NURSE  SIGNATURE__________________________________  ________________________________________________________________________  WHAT IS A BLOOD TRANSFUSION? Blood Transfusion Information  A transfusion is the replacement of blood or some of its parts. Blood is made up of multiple cells which provide different functions.  Red blood cells carry oxygen and are used for blood loss replacement.  White blood cells fight against infection.  Platelets control bleeding.  Plasma helps clot blood.  Other blood products are available for specialized needs, such as hemophilia or other clotting disorders. BEFORE THE TRANSFUSION  Who gives blood for transfusions?   Healthy volunteers who are fully evaluated to make sure their blood is safe. This is blood bank blood. Transfusion therapy is the safest it has ever been in the practice of medicine. Before blood is taken from a donor, a complete history is taken to make sure that person has no history of diseases nor engages in risky social behavior (examples are intravenous drug use or sexual activity with multiple partners). The donor's travel history is screened to minimize risk of transmitting infections, such as malaria. The donated blood is tested for signs of infectious diseases, such as HIV and hepatitis. The blood is then tested to be sure it is compatible with you in order to minimize the chance of a transfusion reaction. If you or a relative donates blood, this is often done in anticipation of surgery and is not appropriate for emergency situations. It takes many days to process the donated blood. RISKS AND COMPLICATIONS Although transfusion therapy is very safe and saves many lives, the main dangers of transfusion include:   Getting an infectious disease.  Developing a transfusion reaction. This  is an allergic reaction to something in the blood you were given. Every precaution is taken to prevent this. The decision to have a blood transfusion has been  considered carefully by your caregiver before blood is given. Blood is not given unless the benefits outweigh the risks. AFTER THE TRANSFUSION  Right after receiving a blood transfusion, you will usually feel much better and more energetic. This is especially true if your red blood cells have gotten low (anemic). The transfusion raises the level of the red blood cells which carry oxygen, and this usually causes an energy increase.  The nurse administering the transfusion will monitor you carefully for complications. HOME CARE INSTRUCTIONS  No special instructions are needed after a transfusion. You may find your energy is better. Speak with your caregiver about any limitations on activity for underlying diseases you may have. SEEK MEDICAL CARE IF:   Your condition is not improving after your transfusion.  You develop redness or irritation at the intravenous (IV) site. SEEK IMMEDIATE MEDICAL CARE IF:  Any of the following symptoms occur over the next 12 hours:  Shaking chills.  You have a temperature by mouth above 102 F (38.9 C), not controlled by medicine.  Chest, back, or muscle pain.  People around you feel you are not acting correctly or are confused.  Shortness of breath or difficulty breathing.  Dizziness and fainting.  You get a rash or develop hives.  You have a decrease in urine output.  Your urine turns a dark color or changes to pink, red, or brown. Any of the following symptoms occur over the next 10 days:  You have a temperature by mouth above 102 F (38.9 C), not controlled by medicine.  Shortness of breath.  Weakness after normal activity.  The white part of the eye turns yellow (jaundice).  You have a decrease in the amount of urine or are urinating less often.  Your urine turns a dark color or changes to pink, red, or brown. Document Released: 08/04/2000 Document Revised: 10/30/2011 Document Reviewed: 03/23/2008 ExitCare Patient Information 2014  [place].  _______________________________________________________________________  Incentive Spirometer  An incentive spirometer is a tool that can help keep your lungs clear and active. This tool measures how well you are filling your lungs with each breath. Taking long deep breaths may help reverse or decrease the chance of developing breathing (pulmonary) problems (especially infection) following:  A long period of time when you are unable to move or be active. BEFORE THE PROCEDURE   If the spirometer includes an indicator to show your best effort, your nurse or respiratory therapist will set it to a desired goal.  If possible, sit up straight or lean slightly forward. Try not to slouch.  Hold the incentive spirometer in an upright position. INSTRUCTIONS FOR USE  1. Sit on the edge of your bed if possible, or sit up as far as you can in bed or on a chair. 2. Hold the incentive spirometer in an upright position. 3. Breathe out normally. 4. Place the mouthpiece in your mouth and seal your lips tightly around it. 5. Breathe in slowly and as deeply as possible, raising the piston or the ball toward the top of the column. 6. Hold your breath for 3-5 seconds or for as long as possible. Allow the piston or ball to fall to the bottom of the column. 7. Remove the mouthpiece from your mouth and breathe out normally. 8. Rest for a few seconds and repeat Steps 1  through 7 at least 10 times every 1-2 hours when you are awake. Take your time and take a few normal breaths between deep breaths. 9. The spirometer may include an indicator to show your best effort. Use the indicator as a goal to work toward during each repetition. 10. After each set of 10 deep breaths, practice coughing to be sure your lungs are clear. If you have an incision (the cut made at the time of surgery), support your incision when coughing by placing a pillow or rolled up towels firmly against it. Once you are able to get  out of bed, walk around indoors and cough well. You may stop using the incentive spirometer when instructed by your caregiver.  RISKS AND COMPLICATIONS  Take your time so you do not get dizzy or light-headed.  If you are in pain, you may need to take or ask for pain medication before doing incentive spirometry. It is harder to take a deep breath if you are having pain. AFTER USE  Rest and breathe slowly and easily.  It can be helpful to keep track of a log of your progress. Your caregiver can provide you with a simple table to help with this. If you are using the spirometer at home, follow these instructions: [place] IF:   You are having difficultly using the spirometer.  You have trouble using the spirometer as often as instructed.  Your pain medication is not giving enough relief while using the spirometer.  You develop fever of 100.5 F (38.1 C) or higher. SEEK IMMEDIATE MEDICAL CARE IF:   You cough up bloody sputum that had not been present before.  You develop fever of 102 F (38.9 C) or greater.  You develop worsening pain at or near the incision site. MAKE SURE YOU:   Understand these instructions.  Will watch your condition.  Will get help right away if you are not doing well or get worse. Document Released: 12/18/2006 Document Revised: 10/30/2011 Document Reviewed: 02/18/2007 [facility] Patient Information 2014 [place], [place].   ________________________________________________________________________

## 2016-03-17 ENCOUNTER — Encounter (INDEPENDENT_AMBULATORY_CARE_PROVIDER_SITE_OTHER): Payer: Self-pay

## 2016-03-17 ENCOUNTER — Encounter (HOSPITAL_COMMUNITY)
Admission: RE | Admit: 2016-03-17 | Discharge: 2016-03-17 | Disposition: A | Discharge status: Home / Self Care | Payer: 59 | Source: Ambulatory Visit | Attending: Gynecologic Oncology | Admitting: Gynecologic Oncology

## 2016-03-17 ENCOUNTER — Encounter (HOSPITAL_COMMUNITY): Payer: Self-pay

## 2016-03-17 DIAGNOSIS — Z01812 Encounter for preprocedural laboratory examination: Secondary | ICD-10-CM | POA: Diagnosis present

## 2016-03-17 DIAGNOSIS — N8502 Endometrial intraepithelial neoplasia [EIN]: Secondary | ICD-10-CM | POA: Diagnosis not present

## 2016-03-17 DIAGNOSIS — Z0183 Encounter for blood typing: Secondary | ICD-10-CM | POA: Diagnosis not present

## 2016-03-17 LAB — BASIC METABOLIC PANEL
ANION GAP: 7 (ref 5–15)
BUN: 12 mg/dL (ref 6–20)
CALCIUM: 9.4 mg/dL (ref 8.9–10.3)
CO2: 25 mmol/L (ref 22–32)
CREATININE: 0.82 mg/dL (ref 0.44–1.00)
Chloride: 107 mmol/L (ref 101–111)
GFR calc Af Amer: >60 mL/min (ref >60)
GFR calc non Af Amer: >60 mL/min (ref >60)
GLUCOSE: 79 mg/dL (ref 65–99)
Potassium: 3.6 mmol/L (ref 3.5–5.1)
Sodium: 139 mmol/L (ref 135–145)

## 2016-03-17 LAB — URINALYSIS, ROUTINE W REFLEX MICROSCOPIC
Bilirubin Urine: NEGATIVE
Glucose, UA: NEGATIVE mg/dL
HGB URINE DIPSTICK: NEGATIVE
Ketones, ur: NEGATIVE mg/dL
Nitrite: NEGATIVE
PH: 7 (ref 5.0–8.0)
Protein, ur: NEGATIVE mg/dL
SPECIFIC GRAVITY, URINE: 1.005 (ref 1.005–1.030)

## 2016-03-17 LAB — CBC
HCT: 36.4 % (ref 36.0–46.0)
HEMOGLOBIN: 11.8 g/dL — AB (ref 12.0–15.0)
MCH: 25.7 pg — AB (ref 26.0–34.0)
MCHC: 32.4 g/dL (ref 30.0–36.0)
MCV: 79.1 fL (ref 78.0–100.0)
Platelets: 310 10*3/uL (ref 150–400)
RBC: 4.6 MIL/uL (ref 3.87–5.11)
RDW: 14.2 % (ref 11.5–15.5)
WBC: 5.6 10*3/uL (ref 4.0–10.5)

## 2016-03-17 LAB — URINE MICROSCOPIC-ADD ON

## 2016-03-17 LAB — ABO/RH: ABO/RH(D): A POS

## 2016-03-19 NOTE — Anesthesia Preprocedure Evaluation (Addendum)
Anesthesia Evaluation  Patient identified by MRN, date of birth, ID band Patient awake    Reviewed: Allergy & Precautions, NPO status , Patient's Chart, lab work & pertinent test results  History of Anesthesia Complications Negative for: history of anesthetic complications  Airway Mallampati: II  TM Distance: >3 FB Neck ROM: Full    Dental no notable dental hx.    Pulmonary sleep apnea ,    Pulmonary exam normal breath sounds clear to auscultation       Cardiovascular negative cardio ROS Normal cardiovascular exam Rhythm:Regular Rate:Normal     Neuro/Psych negative neurological ROS  negative psych ROS   GI/Hepatic Neg liver ROS, GERD  Medicated and Controlled,  Endo/Other  obesity  Renal/GU negative Renal ROS  negative genitourinary   Musculoskeletal negative musculoskeletal ROS (+)   Abdominal   Peds negative pediatric ROS (+)  Hematology negative hematology ROS (+)   Anesthesia Other Findings   Reproductive/Obstetrics negative OB ROS                            Anesthesia Physical Anesthesia Plan  ASA: II  Anesthesia Plan: General   Post-op Pain Management:    Induction: Intravenous  Airway Management Planned: Oral ETT  Additional Equipment:   Intra-op Plan:   Post-operative Plan: Extubation in OR  Informed Consent: I have reviewed the patients History and Physical, chart, labs and discussed the procedure including the risks, benefits and alternatives for the proposed anesthesia with the patient or authorized representative who has indicated his/her understanding and acceptance.   Dental advisory given  Plan Discussed with: CRNA  Anesthesia Plan Comments:         Anesthesia Quick Evaluation

## 2016-03-21 ENCOUNTER — Ambulatory Visit (HOSPITAL_COMMUNITY): Payer: 59 | Admitting: Anesthesiology

## 2016-03-21 ENCOUNTER — Encounter (HOSPITAL_COMMUNITY): Payer: Self-pay | Admitting: *Deleted

## 2016-03-21 ENCOUNTER — Ambulatory Visit (HOSPITAL_COMMUNITY)
Admission: RE | Admit: 2016-03-21 | Discharge: 2016-03-22 | Disposition: A | Discharge status: Home / Self Care | Payer: 59 | Source: Ambulatory Visit | Attending: Obstetrics & Gynecology | Admitting: Obstetrics & Gynecology

## 2016-03-21 ENCOUNTER — Encounter (HOSPITAL_COMMUNITY)
Admission: RE | Disposition: A | Discharge status: Home / Self Care | Payer: Self-pay | Source: Ambulatory Visit | Attending: Obstetrics & Gynecology

## 2016-03-21 DIAGNOSIS — E669 Obesity, unspecified: Secondary | ICD-10-CM | POA: Diagnosis not present

## 2016-03-21 DIAGNOSIS — D259 Leiomyoma of uterus, unspecified: Secondary | ICD-10-CM | POA: Insufficient documentation

## 2016-03-21 DIAGNOSIS — G4730 Sleep apnea, unspecified: Secondary | ICD-10-CM | POA: Insufficient documentation

## 2016-03-21 DIAGNOSIS — N8502 Endometrial intraepithelial neoplasia [EIN]: Secondary | ICD-10-CM | POA: Diagnosis present

## 2016-03-21 DIAGNOSIS — Z9851 Tubal ligation status: Secondary | ICD-10-CM | POA: Diagnosis not present

## 2016-03-21 DIAGNOSIS — Z79899 Other long term (current) drug therapy: Secondary | ICD-10-CM | POA: Insufficient documentation

## 2016-03-21 DIAGNOSIS — N8500 Endometrial hyperplasia, unspecified: Secondary | ICD-10-CM

## 2016-03-21 DIAGNOSIS — K219 Gastro-esophageal reflux disease without esophagitis: Secondary | ICD-10-CM | POA: Insufficient documentation

## 2016-03-21 DIAGNOSIS — Z6834 Body mass index (BMI) 34.0-34.9, adult: Secondary | ICD-10-CM | POA: Insufficient documentation

## 2016-03-21 HISTORY — PX: ROBOTIC ASSISTED TOTAL HYSTERECTOMY WITH BILATERAL SALPINGO OOPHERECTOMY: SHX6086

## 2016-03-21 LAB — CBC
HCT: 37 % (ref 36.0–46.0)
Hemoglobin: 11.8 g/dL — ABNORMAL LOW (ref 12.0–15.0)
MCH: 25.5 pg — AB (ref 26.0–34.0)
MCHC: 31.9 g/dL (ref 30.0–36.0)
MCV: 80.1 fL (ref 78.0–100.0)
PLATELETS: 258 10*3/uL (ref 150–400)
RBC: 4.62 MIL/uL (ref 3.87–5.11)
RDW: 14.5 % (ref 11.5–15.5)
WBC: 9.6 10*3/uL (ref 4.0–10.5)

## 2016-03-21 LAB — TYPE AND SCREEN
ABO/RH(D): A POS
Antibody Screen: NEGATIVE

## 2016-03-21 LAB — CREATININE, SERUM
Creatinine, Ser: 0.67 mg/dL (ref 0.44–1.00)
GFR calc Af Amer: >60 mL/min (ref >60)
GFR calc non Af Amer: >60 mL/min (ref >60)

## 2016-03-21 SURGERY — HYSTERECTOMY, TOTAL, ROBOT-ASSISTED, LAPAROSCOPIC, WITH BILATERAL SALPINGO-OOPHORECTOMY
Anesthesia: General | Laterality: Bilateral

## 2016-03-21 MED ORDER — ONDANSETRON HCL 4 MG/2ML IJ SOLN
INTRAMUSCULAR | Status: AC
  Filled 2016-03-21: qty 2

## 2016-03-21 MED ORDER — SUGAMMADEX SODIUM 200 MG/2ML IV SOLN
INTRAVENOUS | Status: DC | PRN
  Administered 2016-03-21: 190.6 mg via INTRAVENOUS

## 2016-03-21 MED ORDER — KCL IN DEXTROSE-NACL 20-5-0.45 MEQ/L-%-% IV SOLN
INTRAVENOUS | Status: DC
  Administered 2016-03-21 – 2016-03-22 (×2): via INTRAVENOUS
  Filled 2016-03-21 (×2): qty 1000

## 2016-03-21 MED ORDER — STERILE WATER FOR IRRIGATION IR SOLN
Status: DC | PRN
  Administered 2016-03-21: 1000 mL

## 2016-03-21 MED ORDER — PROPOFOL 10 MG/ML IV BOLUS
INTRAVENOUS | Status: AC
  Filled 2016-03-21: qty 40

## 2016-03-21 MED ORDER — SUFENTANIL CITRATE 50 MCG/ML IV SOLN
INTRAVENOUS | Status: DC | PRN
  Administered 2016-03-21: 10 ug via INTRAVENOUS
  Administered 2016-03-21 (×8): 5 ug via INTRAVENOUS

## 2016-03-21 MED ORDER — GABAPENTIN 300 MG PO CAPS
600.0000 mg | ORAL_CAPSULE | Freq: Every day | ORAL | Status: AC
  Administered 2016-03-21: 600 mg via ORAL
  Filled 2016-03-21: qty 2

## 2016-03-21 MED ORDER — PROPOFOL 10 MG/ML IV BOLUS
INTRAVENOUS | Status: DC | PRN
  Administered 2016-03-21: 170 mg via INTRAVENOUS

## 2016-03-21 MED ORDER — LIDOCAINE HCL (CARDIAC) 20 MG/ML IV SOLN
INTRAVENOUS | Status: AC
  Filled 2016-03-21: qty 5

## 2016-03-21 MED ORDER — HYDROMORPHONE HCL 2 MG/ML IJ SOLN
INTRAMUSCULAR | Status: AC
  Filled 2016-03-21: qty 1

## 2016-03-21 MED ORDER — SUFENTANIL CITRATE 50 MCG/ML IV SOLN
INTRAVENOUS | Status: AC
  Filled 2016-03-21: qty 1

## 2016-03-21 MED ORDER — CEFAZOLIN SODIUM-DEXTROSE 2-4 GM/100ML-% IV SOLN
INTRAVENOUS | Status: AC
  Filled 2016-03-21: qty 100

## 2016-03-21 MED ORDER — SODIUM CHLORIDE 0.9 % IJ SOLN
INTRAMUSCULAR | Status: AC
  Filled 2016-03-21: qty 10

## 2016-03-21 MED ORDER — ENOXAPARIN SODIUM 40 MG/0.4ML ~~LOC~~ SOLN
40.0000 mg | SUBCUTANEOUS | Status: DC
  Administered 2016-03-22: 40 mg via SUBCUTANEOUS
  Filled 2016-03-21: qty 0.4

## 2016-03-21 MED ORDER — DICLOFENAC SODIUM 50 MG PO TBEC
50.0000 mg | DELAYED_RELEASE_TABLET | Freq: Three times a day (TID) | ORAL | Status: DC
  Administered 2016-03-21 – 2016-03-22 (×3): 50 mg via ORAL
  Filled 2016-03-21 (×4): qty 1

## 2016-03-21 MED ORDER — HYDROMORPHONE HCL 1 MG/ML IJ SOLN: 0.2000 mg | INTRAMUSCULAR | Status: DC | PRN

## 2016-03-21 MED ORDER — ONDANSETRON HCL 4 MG/2ML IJ SOLN: 4.0000 mg | Freq: Once | INTRAMUSCULAR | Status: DC | PRN

## 2016-03-21 MED ORDER — OXYCODONE-ACETAMINOPHEN 5-325 MG PO TABS: 1.0000 | ORAL_TABLET | ORAL | Status: DC | PRN

## 2016-03-21 MED ORDER — ONDANSETRON HCL 4 MG/2ML IJ SOLN
4.0000 mg | Freq: Four times a day (QID) | INTRAMUSCULAR | Status: DC | PRN
  Administered 2016-03-21: 4 mg via INTRAVENOUS
  Filled 2016-03-21: qty 2

## 2016-03-21 MED ORDER — MIDAZOLAM HCL 2 MG/2ML IJ SOLN
INTRAMUSCULAR | Status: AC
  Filled 2016-03-21: qty 2

## 2016-03-21 MED ORDER — HYDROMORPHONE HCL 1 MG/ML IJ SOLN
INTRAMUSCULAR | Status: AC
  Filled 2016-03-21: qty 1

## 2016-03-21 MED ORDER — FAMOTIDINE 20 MG PO TABS
20.0000 mg | ORAL_TABLET | Freq: Every day | ORAL | Status: DC
  Administered 2016-03-22: 20 mg via ORAL
  Filled 2016-03-21: qty 1

## 2016-03-21 MED ORDER — HYDROMORPHONE HCL 1 MG/ML IJ SOLN
0.2500 mg | INTRAMUSCULAR | Status: DC | PRN
  Administered 2016-03-21 (×4): 0.25 mg via INTRAVENOUS
  Administered 2016-03-21 (×2): 0.5 mg via INTRAVENOUS

## 2016-03-21 MED ORDER — ROCURONIUM BROMIDE 100 MG/10ML IV SOLN
INTRAVENOUS | Status: AC
  Filled 2016-03-21: qty 1

## 2016-03-21 MED ORDER — LACTATED RINGERS IV SOLN
INTRAVENOUS | Status: DC | PRN
  Administered 2016-03-21: 1000 mL

## 2016-03-21 MED ORDER — MIDAZOLAM HCL 5 MG/5ML IJ SOLN
INTRAMUSCULAR | Status: DC | PRN
  Administered 2016-03-21: 2 mg via INTRAVENOUS

## 2016-03-21 MED ORDER — HYDROMORPHONE HCL 1 MG/ML IJ SOLN
INTRAMUSCULAR | Status: DC | PRN
  Administered 2016-03-21: 0.5 mg via INTRAVENOUS
  Administered 2016-03-21: 1 mg via INTRAVENOUS
  Administered 2016-03-21: 0.5 mg via INTRAVENOUS

## 2016-03-21 MED ORDER — SUGAMMADEX SODIUM 200 MG/2ML IV SOLN
INTRAVENOUS | Status: AC
  Filled 2016-03-21: qty 2

## 2016-03-21 MED ORDER — LACTATED RINGERS IV SOLN
INTRAVENOUS | Status: DC | PRN
  Administered 2016-03-21 (×2): via INTRAVENOUS

## 2016-03-21 MED ORDER — LACTATED RINGERS IV SOLN
INTRAVENOUS | Status: DC
  Administered 2016-03-21: 13:00:00 via INTRAVENOUS

## 2016-03-21 MED ORDER — ONDANSETRON HCL 4 MG PO TABS: 4.0000 mg | ORAL_TABLET | Freq: Four times a day (QID) | ORAL | Status: DC | PRN

## 2016-03-21 MED ORDER — ROCURONIUM BROMIDE 100 MG/10ML IV SOLN
INTRAVENOUS | Status: DC | PRN
  Administered 2016-03-21: 20 mg via INTRAVENOUS
  Administered 2016-03-21: 50 mg via INTRAVENOUS

## 2016-03-21 MED ORDER — CEFAZOLIN SODIUM-DEXTROSE 2-4 GM/100ML-% IV SOLN
2.0000 g | INTRAVENOUS | Status: AC
  Administered 2016-03-21: 2 g via INTRAVENOUS

## 2016-03-21 MED ORDER — LIDOCAINE HCL (CARDIAC) 20 MG/ML IV SOLN
INTRAVENOUS | Status: DC | PRN
  Administered 2016-03-21: 50 mg via INTRAVENOUS

## 2016-03-21 MED ORDER — DEXAMETHASONE SODIUM PHOSPHATE 10 MG/ML IJ SOLN
INTRAMUSCULAR | Status: DC | PRN
  Administered 2016-03-21: 5 mg via INTRAVENOUS

## 2016-03-21 SURGICAL SUPPLY — 51 items
BENZOIN TINCTURE PRP APPL 2/3 (GAUZE/BANDAGES/DRESSINGS) ×3 IMPLANT
CHLORAPREP W/TINT 26ML (MISCELLANEOUS) ×3 IMPLANT
CLOSURE WOUND 1/2 X4 (GAUZE/BANDAGES/DRESSINGS) ×1
COVER SURGICAL LIGHT HANDLE (MISCELLANEOUS) ×3 IMPLANT
COVER TIP SHEARS 8 DVNC (MISCELLANEOUS) ×1 IMPLANT
COVER TIP SHEARS 8MM DA VINCI (MISCELLANEOUS) ×2
DRAPE ARM DVNC X/XI (DISPOSABLE) ×4 IMPLANT
DRAPE COLUMN DVNC XI (DISPOSABLE) ×1 IMPLANT
DRAPE DA VINCI XI ARM (DISPOSABLE) ×8
DRAPE DA VINCI XI COLUMN (DISPOSABLE) ×2
DRAPE SHEET LG 3/4 BI-LAMINATE (DRAPES) ×6 IMPLANT
DRAPE SURG IRRIG POUCH 19X23 (DRAPES) ×3 IMPLANT
DRSG TEGADERM 2-3/8X2-3/4 SM (GAUZE/BANDAGES/DRESSINGS) ×3 IMPLANT
ELECT REM PT RETURN 9FT ADLT (ELECTROSURGICAL) ×3
ELECTRODE REM PT RTRN 9FT ADLT (ELECTROSURGICAL) ×1 IMPLANT
GAUZE SPONGE 2X2 8PLY STRL LF (GAUZE/BANDAGES/DRESSINGS) ×1 IMPLANT
GLOVE BIO SURGEON STRL SZ 6.5 (GLOVE) ×10 IMPLANT
GLOVE BIO SURGEONS STRL SZ 6.5 (GLOVE) ×5
GLOVE BIOGEL PI IND STRL 7.0 (GLOVE) ×2 IMPLANT
GLOVE BIOGEL PI INDICATOR 7.0 (GLOVE) ×4
GOWN STRL REUS W/ TWL LRG LVL3 (GOWN DISPOSABLE) ×3 IMPLANT
GOWN STRL REUS W/TWL LRG LVL3 (GOWN DISPOSABLE) ×6
HOLDER FOLEY CATH W/STRAP (MISCELLANEOUS) ×3 IMPLANT
IRRIG SUCT STRYKERFLOW 2 WTIP (MISCELLANEOUS) ×3
IRRIGATION SUCT STRKRFLW 2 WTP (MISCELLANEOUS) ×1 IMPLANT
KIT BASIN OR (CUSTOM PROCEDURE TRAY) ×3 IMPLANT
MANIPULATOR UTERINE 4.5 ZUMI (MISCELLANEOUS) ×3 IMPLANT
OCCLUDER COLPOPNEUMO (BALLOONS) ×3 IMPLANT
PAD POSITIONING PINK XL (MISCELLANEOUS) ×3 IMPLANT
PORT ACCESS TROCAR AIRSEAL 12 (TROCAR) IMPLANT
PORT ACCESS TROCAR AIRSEAL 5M (TROCAR)
POUCH SPECIMEN RETRIEVAL 10MM (ENDOMECHANICALS) IMPLANT
SEAL CANN UNIV 5-8 DVNC XI (MISCELLANEOUS) ×4 IMPLANT
SEAL XI 5MM-8MM UNIVERSAL (MISCELLANEOUS) ×8
SET TRI-LUMEN FLTR TB AIRSEAL (TUBING) IMPLANT
SHEET LAVH (DRAPES) ×3 IMPLANT
SOLUTION ELECTROLUBE (MISCELLANEOUS) ×3 IMPLANT
SPONGE GAUZE 2X2 STER 10/PKG (GAUZE/BANDAGES/DRESSINGS) ×2
STRIP CLOSURE SKIN 1/2X4 (GAUZE/BANDAGES/DRESSINGS) ×2 IMPLANT
SUT VIC AB 0 CT1 27 (SUTURE) ×2
SUT VIC AB 0 CT1 27XBRD ANTBC (SUTURE) ×1 IMPLANT
SUT VIC AB 4-0 PS2 27 (SUTURE) ×6 IMPLANT
SYR 50ML LL SCALE MARK (SYRINGE) ×3 IMPLANT
TAPE STRIPS DRAPE STRL (GAUZE/BANDAGES/DRESSINGS) ×3 IMPLANT
TOWEL OR 17X26 10 PK STRL BLUE (TOWEL DISPOSABLE) ×6 IMPLANT
TRAP SPECIMEN MUCOUS 40CC (MISCELLANEOUS) ×3 IMPLANT
TRAY FOLEY W/METER SILVER 14FR (SET/KITS/TRAYS/PACK) ×3 IMPLANT
TRAY LAPAROSCOPIC (CUSTOM PROCEDURE TRAY) ×3 IMPLANT
TROCAR BLADELESS OPT 5 100 (ENDOMECHANICALS) ×3 IMPLANT
UNDERPAD 30X30 INCONTINENT (UNDERPADS AND DIAPERS) ×3 IMPLANT
WATER STERILE IRR 1500ML POUR (IV SOLUTION) ×6 IMPLANT

## 2016-03-21 NOTE — H&P (Signed)
Ebony Nichols 55 y.o. female  CC:     Chief Complaint  Patient presents with  . Follow-up    HPI: Patient was seen in consultation at the request of Dr. Christophe Louis. Primary physician Dr. Izora Gala.  Patient is a very pleasant 55 year old postmenopausal patient who began experiencing some irregular bleeding starting in January. She underwent a hysteroscopy D&C with polypectomy with demise sure on 01/12/2016. Her preoperative ultrasound revealed a 6.8 x 4.3 x 3.2 cm uterus. The endometrium was filled with a hypoechoic mass on the anterior wall measuring 1.5 cm. No blood flow was identified. She had 3 small fibroids with the largest being 1.2 cm. The ovaries appeared normal bilaterally. 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 It is for this reason that she is referred to [place] today. The patient is otherwise in her usual state of health and feels quite well. Her biggest concern regarding the surgery that Dr. Landry Mellow has recommended for her is the amount of time that she will need to take off work. She's only been at her current employment for a year and does not have another week of vacation for a year.  There is no cancer history in her family. Her daughter is 43 years old and is on hemodialysis for intrinsic kidney disease.  Review of Systems  Constitutional: Denies fever. Skin: No rash Cardiovascular: No chest pain, shortness of breath, or edema  Pulmonary: No cough  Gastro Intestinal: No nausea, vomiting, constipation, or diarrhea reported. No change in bowel movement.  Genitourinary: No frequency, urgency, or dysuria.  Denies vaginal bleeding and discharge since her procedure.  Musculoskeletal: No joint swelling or pain.  Neurologic: No weakness Psychology: No complaints   Current Meds:      Outpatient Encounter Prescriptions as of 02/16/2016  Medication Sig  . Multiple Vitamins-Calcium (ONE-A-DAY WOMENS PO) Take 1 tablet by mouth daily.   Marland Kitchen ibuprofen (ADVIL,MOTRIN) 600 MG tablet 1 po every 6 hours as needed for (Patient not taking: Reported on 02/16/2016)  . omeprazole (PRILOSEC) 20 MG capsule Take 20 mg by mouth daily. Reported on 02/16/2016  . oxyCODONE-acetaminophen (ROXICET) 5-325 MG tablet Take 1-2 tablets by mouth every 4 (four) hours as needed for severe pain. (Patient not taking: Reported on 02/16/2016)   No facility-administered encounter medications on file as of 02/16/2016.    Allergy: No Known Allergies  Social Hx:   Social History        Social History  . Marital Status: Unknown    Spouse Name: N/A  . Number of Children: N/A  . Years of Education: N/A      Occupational History  . Not on file.        Social History Main Topics  . Smoking status: Never Smoker   . Smokeless tobacco: Never Used  . Alcohol Use: No  . Drug Use: No  . Sexual Activity: Yes    Birth Control/ Protection: Post-menopausal       Other Topics Concern  . Not on file   Social History Narrative    Past Surgical Hx:        Past Surgical History  Procedure Laterality Date  . Nasal sinus surgery    . Tubal ligation    . Wisdom tooth extraction    . Dilatation & curettage/hysteroscopy with myosure N/A 01/12/2016    Procedure: DILATATION & CURETTAGE/HYSTEROSCOPY WITH MYOSURE POLYPECTOMY;  Surgeon: Christophe Louis, MD;  Location: [facility];  Service: Gynecology;  Laterality: N/A;  Past Medical Hx:       Past Medical History  Diagnosis Date  . SVD (spontaneous vaginal delivery)     x 1  . Sleep apnea     does not use cpap  . GERD (gastroesophageal reflux disease)   . Shingles     history  . Anemia     history    Oncology Hx:   No history exists.    Family Hx: History reviewed. No pertinent family history.  Vitals:  Blood pressure 128/85, pulse 89, temperature 97.9 F (36.6 C), temperature source Oral, resp. rate 19, height 5\' 5"  (1.651 m), weight 212 lb (96.163 kg), SpO2  100 %.  Physical Exam: Well-nourished well-developed female in no acute distress.  Neck: Supple, no lymphadenopathy, no thyromegaly.  Lungs: Clear to auscultation bilaterally.  Cardiac: Regular rate and rhythm.  Abdomen: Soft, nontender, nondistended. There are no palpable masses. There is no hepatomegaly.  Groins: No lymphadenopathy.  Extremity: No edema.  Pelvic: Normal female genitalia. Vagina slightly atrophic. The cervix is visualized. There's no visible lesions. There's a physiologic discharge. Bimanual examination the uterus is of normal size shape and consistency. There are no adnexal masses.  Assessment/Plan:  55 year old gravida 1 para 1 with Copp with hyperplasia with atypia. We discussed proceeding with definitive surgery including a total hysterectomy bilateral salpingo-oophorectomy and staging based on frozen section findings. I believe this can be done in a minimally invasive fashion. The patient is amenable and wishes to proceed with surgery. She was provided information regarding her diagnosis and the treatment plan to take to her employer. She does walk and is on her feet much during the day but does not do any heavy lifting procedure pulling. I discussed with her that the every week postop visit if she was doing well we would feel comfortable letting her go back to work but she states that her employer will most likely be willing to work with her. She will speak to her employer tomorrow and then contact us so we can schedule her surgery appropriately.  She works third shift at work and would be able to come in for preop visit and Precare visit without interrupting her work schedule.          Office Visit on 02/16/2016        Detailed Report

## 2016-03-21 NOTE — Anesthesia Postprocedure Evaluation (Signed)
Anesthesia Post Note  Patient: Ebony Nichols  Procedure(s) Performed: Procedure(s) (LRB): XI ROBOTIC ASSISTED TOTAL HYSTERECTOMY WITH BILATERAL SALPINGO OOPHORECTOMY (Bilateral)  Patient location during evaluation: PACU Anesthesia Type: General Level of consciousness: awake and alert Pain management: pain level controlled Vital Signs Assessment: post-procedure vital signs reviewed and stable Respiratory status: spontaneous breathing, nonlabored ventilation, respiratory function stable and patient connected to nasal cannula oxygen Cardiovascular status: blood pressure returned to baseline and stable Postop Assessment: no signs of nausea or vomiting Anesthetic complications: no    Last Vitals:  Vitals:   03/21/16 0517 03/21/16 0956  BP: 115/85 126/83  Pulse:  (!) 59  Resp:  16  Temp:  36.4 C    Last Pain:  Vitals:   03/21/16 1130  TempSrc:   PainSc: 4                  Montez Hageman

## 2016-03-21 NOTE — Op Note (Signed)
PATIENT: [name] DATE OF BIRTH: 11/23/1960 ENCOUNTER DATE: 03/21/2016   Preop Diagnosis: complex hyperplasia with atypica   Postoperative Diagnosis: no evidence of hyperplasia or cancer on IOFS.  Surgery: Total robotic hysterectomy bilateral salpingo-oophorectomy  Surgeons:  Imagene Gurney A. Alycia Rossetti, MD; Lahoma Crocker, MD   Anesthesia: General   Estimated blood loss: 50 ml   IVF: 1500 ml   Urine output:  60 ml   Complications: None   Pathology: Uterus, cervix, bilateral tubes and ovaries  Operative findings: Normal uterus, cervix and bilateral adnexa. IOFS with no evidence of hyperplasia or cancer.  Procedure: The patient was identified in the preoperative holding area. Informed consent was signed on the chart. Patient was seen history was reviewed and exam was performed.   The patient was then taken to the operating room and placed in the supine position with SCD hose on. She was then placed in the dorsolithotomy position. Her arms were tucked at her side with appropriate precautions on the gel pad. General anesthesia was then induced without difficulty. Shoulder blocks were then placed in the usual fashion with appropriate precautions. A OG-tube was placed to suction. First timeout was performed to confirm the patient, procedure, antibiotic, allergy status, estimated blood loss and OR time. The perineum was then prepped in the usual fashion with Betadine. A 14 French Foley was inserted into the bladder under sterile conditions. A sterile speculum was placed in the vagina. The cervix was without lesions. The cervix was grasped with a single-tooth tenaculum. The dilator without difficulty. A ZUMI with a large Koe ring was placed without difficulty. The abdomen was then prepped with 2 Chlor prep sponges per protocol.   Patient was then draped after the prep was dried. Second timeout was performed to confirm the above. After again confirming OG tube placement and it was to suction. A  stab-wound was made in left upper quadrant 2 cm below the costal margin on the left in the midclavicular line. A 5 mm operative report was used to assure intra-abdominal placement. The abdomen was insufflated. At this point all points during the procedure the patient's intra-abdominal pressure was not increased over 15 mm of mercury. After insufflation was complete, the patient was placed in deep Trendelenburg position. 25 cm above the pubic symphysis that area was marked the camera port. Bilateral robotic ports were marked 10 cm from the midline incision. Under direct visualization each of the trochars was placed into the abdomen. Fourth trocar was placed in the LLQ 2 cm above and medial to the ACIS. The small bowel was folded on its mesentery to allow visualization to the pelvis. The 5 mm LUQ port was then converted to a 10/12 port under direct visualization.  After assuring adequate visualization, the robot was then docked in the usual fashion. Under direct visualization the robotic instruments replaced.   The round ligament on the patient's right side was transected with monopolar cautery. The anterior and posterior leaves of the broad ligament were then taken down in the usual fashion. The ureter was identified on the medial leaf of the broad ligament. A window was made between the IP and the ureter. The IP was coagulated with bipolar cautery and transected. The posterior leaf of the broad ligament was taken down to the level of the KOH ring. The bladder flap was created using meticulous dissection and pinpoint cautery. The uterine vessels were coagulated with bipolar cautery. The uterine vessels were then transected and the C loop was created. The same procedure was  performed on the patient's left side.   The pneumo-occulder in the vagina was then insufflated. The colpotomy was then created in the usual fashion. The specimen was then delivered to the vagina and sent for frozen section. The vaginal cuff was  closed with a running 0 Vicryl on CT 1 suture.  Our attention was then drawn to opening the paravesical space on her right side the perirectal space was also opened. The obturator nerve was identified. The genitofemoral nerve was identified. The perirectal space was opened. At this point frozen section returned as no evidence of hyperplasia or carcinoma therefore no further surgery indicated.   The abdomen and pelvis were copiously irrigated and noted to be hemostatic. The robotic instruments were removed under direct visualization as were the robotic trochars. The pneumoperitoneum was removed. The patient was then taken out of the Trendelenburg position. Using of 0 Vicryl on a UR 6 needle was used to reapproximate the subcutaneous tissues of the port in the left upper quadrant. The skin was closed using 4-0 Vicryl. Steri-Strips and benzoin were applied. The pneumo occluded balloon was removed from the vagina. The vagina was swabbed and noted to be hemostatic.   All instrument needle and Ray-Tec counts were correct x2. The patient tolerated the procedure well and was taken to the recovery room in stable condition. This is Nancy Marus dictating an operative note on patient Allishia Starkey.

## 2016-03-21 NOTE — Transfer of Care (Signed)
Immediate Anesthesia Transfer of Care Note  Patient: [name]  Procedure(s) Performed: Procedure(s): XI ROBOTIC ASSISTED TOTAL HYSTERECTOMY WITH BILATERAL SALPINGO OOPHORECTOMY (Bilateral)  Patient Location: PACU  Anesthesia Type:General  Level of Consciousness: awake, alert , oriented and patient cooperative  Airway & Oxygen Therapy: Patient Spontanous Breathing and Patient connected to face mask oxygen  Post-op Assessment: Report given to RN and Post -op Vital signs reviewed and stable  Post vital signs: Reviewed and stable  Last Vitals:  Vitals:   03/21/16 0516 03/21/16 0517  BP:  115/85  Pulse: 75   Resp: 18   Temp: 37.1 C     Last Pain:  Vitals:   03/21/16 0516  TempSrc: Oral      Patients Stated Pain Goal: 4 ([id number])  Complications: No apparent anesthesia complications

## 2016-03-21 NOTE — Interval H&P Note (Signed)
History and Physical Interval Note:  03/21/2016 7:32 AM  Ebony Nichols  has presented today for surgery, with the diagnosis of endometrial hyperplasia  The various methods of treatment have been discussed with the patient and family. After consideration of risks, benefits and other options for treatment, the patient has consented to  Procedure(s): XI ROBOTIC ASSISTED TOTAL HYSTERECTOMY WITH BILATERAL SALPINGO OOPHORECTOMY AND POSSIBLE STAGING (Bilateral) as a surgical intervention .  The patient's history has been reviewed, patient examined, no change in status, stable for surgery.  I have reviewed the patient's chart and labs.  Questions were answered to the patient's satisfaction.     [place] A.

## 2016-03-21 NOTE — Anesthesia Procedure Notes (Signed)
Procedure Name: Intubation Date/Time: 03/21/2016 7:50 AM Performed by: Sherian Maroon A Pre-anesthesia Checklist: Patient identified, Emergency Drugs available, Suction available, Patient being monitored and Timeout performed Patient Re-evaluated:Patient Re-evaluated prior to inductionOxygen Delivery Method: Circle system utilized Preoxygenation: Pre-oxygenation with 100% oxygen Intubation Type: IV induction Ventilation: Mask ventilation without difficulty Laryngoscope Size: Mac and 3 Grade View: Grade I Tube type: Oral Tube size: 7.5 mm Number of attempts: 1 Airway Equipment and Method: Stylet Placement Confirmation: ETT inserted through vocal cords under direct vision,  positive ETCO2 and breath sounds checked- equal and bilateral Secured at: 21 cm Tube secured with: Tape Dental Injury: Teeth and Oropharynx as per pre-operative assessment

## 2016-03-21 NOTE — Discharge Instructions (Addendum)
03/21/2016  Return to work: 4-6 weeks if applicable  Activity: 1. Be up and out of the bed during the day.  Take a nap if needed.  You may walk up steps but be careful and use the hand rail.  Stair climbing will tire you more than you think, you may need to stop part way and rest.   2. No lifting or straining for 6 weeks.  3. No driving for 1 week(s).  Do not drive if you are taking narcotic pain medicine.  4. Shower daily.  Use soap and water on your incision and pat dry; don't rub.  No tub baths until cleared by your surgeon.   5. No sexual activity and nothing in the vagina for 8 weeks.  6. You may experience a small amount of clear drainage from your incisions, which is normal.  If the drainage persists or increases, please call the office.   Diet: 1. Low sodium Heart Healthy Diet is recommended.  2. It is safe to use a laxative, such as Miralax or Colace, if you have difficulty moving your bowels.   Wound Care: 1. Keep clean and dry.  Shower daily.  Reasons to call the Doctor:  Fever - Oral temperature greater than 100.4 degrees Fahrenheit  Foul-smelling vaginal discharge  Difficulty urinating  Nausea and vomiting  Increased pain at the site of the incision that is unrelieved with pain medicine.  Difficulty breathing with or without chest pain  New calf pain especially if only on one side  Sudden, continuing increased vaginal bleeding with or without clots.   Contacts: For questions or concerns you should contact:  Dr. Nancy Marus at [phone] or [phone]  Dr. Everitt Amber at [phone]  Joylene John, NP at [phone]  After Hours: call [phone] and have the GYN Oncologist paged/contacted  Acetaminophen; Oxycodone tablets What is this medicine? ACETAMINOPHEN; OXYCODONE (a set a MEE noe fen; ox i KOE done) is a pain reliever. It is used to treat moderate to severe pain. This medicine may be used for other purposes; ask your health care  provider or pharmacist if you have questions. What should I tell my health care provider before I take this medicine? They need to know if you have any of these conditions: -brain tumor -Crohn's disease, inflammatory bowel disease, or ulcerative colitis -drug abuse or addiction -head injury -heart or circulation problems -if you often drink alcohol -kidney disease or problems going to the bathroom -liver disease -lung disease, asthma, or breathing problems -an unusual or allergic reaction to acetaminophen, oxycodone, other opioid analgesics, other medicines, foods, dyes, or preservatives -pregnant or trying to get pregnant -breast-feeding How should I use this medicine? Take this medicine by mouth with a full glass of water. Follow the directions on the prescription label. You can take it with or without food. If it upsets your stomach, take it with food. Take your medicine at regular intervals. Do not take it more often than directed. Talk to your pediatrician regarding the use of this medicine in children. Special care may be needed. Patients over 10 years old may have a stronger reaction and need a smaller dose. Overdosage: If you think you have taken too much of this medicine contact a poison control center or emergency room at once. NOTE: This medicine is only for you. Do not share this medicine with others. What if I miss a dose? If you miss a dose, take it as soon as you can. If it is almost time  for your next dose, take only that dose. Do not take double or extra doses. What may interact with this medicine? -alcohol -antihistamines -barbiturates like amobarbital, butalbital, butabarbital, methohexital, pentobarbital, phenobarbital, thiopental, and secobarbital -benztropine -drugs for bladder problems like solifenacin, trospium, oxybutynin, tolterodine, hyoscyamine, and methscopolamine -drugs for breathing problems like ipratropium and tiotropium -drugs for certain stomach or  intestine problems like propantheline, homatropine methylbromide, glycopyrrolate, atropine, belladonna, and dicyclomine -general anesthetics like etomidate, ketamine, nitrous oxide, propofol, desflurane, enflurane, halothane, isoflurane, and sevoflurane -medicines for depression, anxiety, or psychotic disturbances -medicines for sleep -muscle relaxants -naltrexone -narcotic medicines (opiates) for pain -phenothiazines like perphenazine, thioridazine, chlorpromazine, mesoridazine, fluphenazine, prochlorperazine, promazine, and trifluoperazine -scopolamine -tramadol -trihexyphenidyl This list may not describe all possible interactions. Give your health care provider a list of all the medicines, herbs, non-prescription drugs, or dietary supplements you use. Also tell them if you smoke, drink alcohol, or use illegal drugs. Some items may interact with your medicine. What should I watch for while using this medicine? Tell your doctor or health care professional if your pain does not go away, if it gets worse, or if you have new or a different type of pain. You may develop tolerance to the medicine. Tolerance means that you will need a higher dose of the medication for pain relief. Tolerance is normal and is expected if you take this medicine for a long time. Do not suddenly stop taking your medicine because you may develop a severe reaction. Your body becomes used to the medicine. This does NOT mean you are addicted. Addiction is a behavior related to getting and using a drug for a non-medical reason. If you have pain, you have a medical reason to take pain medicine. Your doctor will tell you how much medicine to take. If your doctor wants you to stop the medicine, the dose will be slowly lowered over time to avoid any side effects. You may get drowsy or dizzy. Do not drive, use machinery, or do anything that needs mental alertness until you know how this medicine affects you. Do not stand or sit up  quickly, especially if you are an older patient. This reduces the risk of dizzy or fainting spells. Alcohol may interfere with the effect of this medicine. Avoid alcoholic drinks. There are different types of narcotic medicines (opiates) for pain. If you take more than one type at the same time, you may have more side effects. Give your health care provider a list of all medicines you use. Your doctor will tell you how much medicine to take. Do not take more medicine than directed. Call emergency for help if you have problems breathing. The medicine will cause constipation. Try to have a bowel movement at least every 2 to 3 days. If you do not have a bowel movement for 3 days, call your doctor or health care professional. Do not take Tylenol (acetaminophen) or medicines that have acetaminophen with this medicine. Too much acetaminophen can be very dangerous. Many nonprescription medicines contain acetaminophen. Always read the labels carefully to avoid taking more acetaminophen. What side effects may I notice from receiving this medicine? Side effects that you should report to your doctor or health care professional as soon as possible: -allergic reactions like skin rash, itching or hives, swelling of the face, lips, or tongue -breathing difficulties, wheezing -confusion -light headedness or fainting spells -severe stomach pain -unusually weak or tired -yellowing of the skin or the whites of the eyes Side effects that usually do not require  medical attention (report to your doctor or health care professional if they continue or are bothersome): -dizziness -drowsiness -nausea -vomiting This list may not describe all possible side effects. Call your doctor for medical advice about side effects. You may report side effects to FDA at 1-800-FDA-1088. Where should I keep my medicine? Keep out of the reach of children. This medicine can be abused. Keep your medicine in a safe place to protect it from  theft. Do not share this medicine with anyone. Selling or giving away this medicine is dangerous and against the law. This medicine may cause accidental overdose and death if it taken by other adults, children, or pets. Mix any unused medicine with a substance like cat litter or coffee grounds. Then throw the medicine away in a sealed container like a sealed bag or a coffee can with a lid. Do not use the medicine after the expiration date. Store at room temperature between 20 and 25 degrees C (68 and 77 degrees F). NOTE: This sheet is a summary. It may not cover all possible information. If you have questions about this medicine, talk to your doctor, pharmacist, or health care provider.    2016, [employer] Standard. (2014-07-08 15:18:46)

## 2016-03-22 ENCOUNTER — Encounter: Payer: Self-pay | Admitting: Gynecologic Oncology

## 2016-03-22 DIAGNOSIS — D259 Leiomyoma of uterus, unspecified: Secondary | ICD-10-CM | POA: Diagnosis not present

## 2016-03-22 LAB — CBC
HEMATOCRIT: 35.7 % — AB (ref 36.0–46.0)
HEMOGLOBIN: 11.4 g/dL — AB (ref 12.0–15.0)
MCH: 25.6 pg — AB (ref 26.0–34.0)
MCHC: 31.9 g/dL (ref 30.0–36.0)
MCV: 80 fL (ref 78.0–100.0)
PLATELETS: 254 10*3/uL (ref 150–400)
RBC: 4.46 MIL/uL (ref 3.87–5.11)
RDW: 14.7 % (ref 11.5–15.5)
WBC: 8.1 10*3/uL (ref 4.0–10.5)

## 2016-03-22 LAB — BASIC METABOLIC PANEL
ANION GAP: 5 (ref 5–15)
BUN: 8 mg/dL (ref 6–20)
CHLORIDE: 108 mmol/L (ref 101–111)
CO2: 27 mmol/L (ref 22–32)
Calcium: 8.9 mg/dL (ref 8.9–10.3)
Creatinine, Ser: 0.83 mg/dL (ref 0.44–1.00)
GFR calc Af Amer: >60 mL/min (ref >60)
GFR calc non Af Amer: >60 mL/min (ref >60)
GLUCOSE: 112 mg/dL — AB (ref 65–99)
POTASSIUM: 4.3 mmol/L (ref 3.5–5.1)
Sodium: 140 mmol/L (ref 135–145)

## 2016-03-22 MED ORDER — OXYCODONE-ACETAMINOPHEN 5-325 MG PO TABS
1.0000 | ORAL_TABLET | ORAL | 0 refills | Status: AC | PRN
Start: 2016-03-22 — End: ?

## 2016-03-22 NOTE — Discharge Summary (Signed)
Physician Discharge Summary  Patient ID: Ebony Nichols MRN: [id number] DOB/AGE: 08-27-1960 55 y.o.  Admit date: 03/21/2016 Discharge date: 03/22/2016  Admission Diagnoses: Complex endometrial hyperplasia with atypia  Discharge Diagnoses:  Principal Problem:   Complex endometrial hyperplasia with atypia   Discharged Condition:  The patient is in good condition and stable for discharge.   Hospital Course: On 03/21/2016, the patient underwent the following: Procedure(s): XI ROBOTIC ASSISTED TOTAL HYSTERECTOMY WITH BILATERAL SALPINGO OOPHORECTOMY.   The postoperative course was uneventful.  She was discharged to home on postoperative day 1 tolerating a regular diet, passing flatus, minimal pain, voiding.  Consults: None  Significant Diagnostic Studies: None  Treatments: surgery: see above  Discharge Exam: Blood pressure 117/67, pulse 77, temperature 98.7 F (37.1 C), temperature source Oral, resp. rate 16, height 5\' 6"  (1.676 m), weight 210 lb (95.3 kg), SpO2 97 %. General appearance: alert, cooperative and no distress Resp: clear to auscultation bilaterally Cardio: regular rate and rhythm, S1, S2 normal, no murmur, click, rub or gallop GI: soft, non-tender; bowel sounds normal; no masses,  no organomegaly Extremities: extremities normal, atraumatic, no cyanosis or edema Incision/Wound: Lap sites to the abdomen, dressings removed, steri strips in place, no erythema or drainage  Disposition: 01-Home or Self Care  Discharge Instructions    Call MD for:  difficulty breathing, headache or visual disturbances    Complete by:  As directed   Call MD for:  extreme fatigue    Complete by:  As directed   Call MD for:  hives    Complete by:  As directed   Call MD for:  persistant dizziness or light-headedness    Complete by:  As directed   Call MD for:  persistant nausea and vomiting    Complete by:  As directed   Call MD for:  redness, tenderness, or signs of infection (pain, swelling,  redness, odor or green/yellow discharge around incision site)    Complete by:  As directed   Call MD for:  severe uncontrolled pain    Complete by:  As directed   Call MD for:  temperature >100.4    Complete by:  As directed   Diet - low sodium heart healthy    Complete by:  As directed   Driving Restrictions    Complete by:  As directed   No driving for 1 week.  Do not take narcotics and drive.   Increase activity slowly    Complete by:  As directed   Lifting restrictions    Complete by:  As directed   No lifting greater than 10 lbs.   Sexual Activity Restrictions    Complete by:  As directed   No sexual activity, nothing in the vagina, for 8 weeks.       Medication List    TAKE these medications   ibuprofen 600 MG tablet Commonly known as:  ADVIL 1 po every 6 hours as needed for   ONE-A-DAY WOMENS PO Take 1 tablet by mouth daily.   oxyCODONE-acetaminophen 5-325 MG tablet Commonly known as:  PERCOCET/ROXICET Take 1-2 tablets by mouth every 4 (four) hours as needed (moderate to severe pain (when tolerating fluids)). What changed:  reasons to take this   ranitidine 150 MG tablet Commonly known as:  ZANTAC Take 150 mg by mouth daily as needed for heartburn.      Follow-up Information    GEHRIG,PAOLA A., MD Follow up on 04/19/2016.   Specialty:  Gynecologic Oncology Why:  at 10:30am at  the [facility].   Contact information: [address] [place] [address] [phone]           Greater than thirty minutes were spend for face to face discharge instructions and discharge orders/summary in EPIC.   Signed: Michel Hendon DEAL 03/22/2016, 9:31 AM

## 2016-03-23 ENCOUNTER — Telehealth: Payer: Self-pay

## 2016-03-23 NOTE — Telephone Encounter (Signed)
Orders received from [facility] to contact the patient with final surgical pathology report findings were : No carcinoma identified. Also assess how the patient is doing postoperatively. Patient contacted and updated with pathology results,patient states understanding. Patient also states she is doing well postoperatively,  denies pain , constipation or appetite difficulties.Will call with any changes , questions or concerns.

## 2016-03-28 ENCOUNTER — Telehealth: Payer: Self-pay | Admitting: Gynecologic Oncology

## 2016-03-28 ENCOUNTER — Telehealth: Payer: Self-pay

## 2016-03-28 NOTE — Telephone Encounter (Signed)
TC to patient. Doing well. No concerns, will keep post-op appointment. PG

## 2016-03-28 NOTE — Telephone Encounter (Signed)
Incoming call , patient states she has some intermittent "itching" to her incisions and has been applying coco butter to them. Writer educated the patient that the coco butter would break down the glue used at the incisions. Writer recommended that she use OTC hydrocortisone cream to help with the itching. Patient states understanding, denies further questions. Writer encouraged the patient to call with additions changes , questions or concerns. Melissa Cross, APNP updated.

## 2016-04-19 ENCOUNTER — Encounter: Payer: Self-pay | Admitting: Gynecologic Oncology

## 2016-04-19 ENCOUNTER — Ambulatory Visit: Payer: 59 | Attending: Gynecologic Oncology | Admitting: Gynecologic Oncology

## 2016-04-19 VITALS — BP 120/75 | HR 65 | Temp 98.4°F | Resp 18 | Ht 66.0 in | Wt 210.7 lb

## 2016-04-19 DIAGNOSIS — Z90722 Acquired absence of ovaries, bilateral: Secondary | ICD-10-CM | POA: Insufficient documentation

## 2016-04-19 DIAGNOSIS — N8502 Endometrial intraepithelial neoplasia [EIN]: Secondary | ICD-10-CM | POA: Diagnosis present

## 2016-04-19 DIAGNOSIS — G4730 Sleep apnea, unspecified: Secondary | ICD-10-CM | POA: Diagnosis not present

## 2016-04-19 DIAGNOSIS — K219 Gastro-esophageal reflux disease without esophagitis: Secondary | ICD-10-CM | POA: Insufficient documentation

## 2016-04-19 DIAGNOSIS — Z90710 Acquired absence of both cervix and uterus: Secondary | ICD-10-CM | POA: Insufficient documentation

## 2016-04-19 DIAGNOSIS — Z8619 Personal history of other infectious and parasitic diseases: Secondary | ICD-10-CM | POA: Insufficient documentation

## 2016-04-19 DIAGNOSIS — D649 Anemia, unspecified: Secondary | ICD-10-CM | POA: Insufficient documentation

## 2016-04-19 DIAGNOSIS — Z9889 Other specified postprocedural states: Secondary | ICD-10-CM

## 2016-04-19 NOTE — Patient Instructions (Signed)
No need to follow up in with Dr. Alycia Rossetti unless needed.  Please call for any questions or concerns.

## 2016-04-19 NOTE — Progress Notes (Signed)
Consult Note: Gyn-Onc  Ebony Nichols 55 y.o. female  CC:  Chief Complaint  Patient presents with  . Complex endometrial hyperplasia with atypia    follow up visit    HPI: Patient was seen today in consultation at the request of Dr. Christophe Louis. Primary physician Dr. Izora Gala.  Patient is a very pleasant 55 year old postmenopausal patient who began experiencing some irregular bleeding starting in January. She underwent a hysteroscopy D&C with polypectomy with demise sure on 01/12/2016. Her preoperative ultrasound revealed a 6.8 x 4.3 x 3.2 cm uterus. The endometrium was filled with a hypoechoic mass on the anterior wall measuring 1.5 cm. No blood flow was identified. She had 3 small fibroids with the largest being 1.2 cm. The ovaries appeared normal bilaterally. 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 It is for this reason that she is referred to [place] today. The patient is otherwise in her usual state of health and feels quite well. Her biggest concern regarding the surgery that Dr. Landry Mellow has recommended for her is the amount of time that she will need to take off work. She's only been at her current employment for a year and does not have another week of vacation for a year.  There is no cancer history in her family. Her daughter is 22 years old and is on hemodialysis for intrinsic kidney disease.  ENCOUNTER DATE: 03/21/2016 Preop Diagnosis: complex hyperplasia with atypica  Postoperative Diagnosis: no evidence of hyperplasia or cancer on IOFS. Surgery: Total robotic hysterectomy bilateral salpingo-oophorectomy Operative findings: Normal uterus, cervix and bilateral adnexa. IOFS with no evidence of hyperplasia or cancer.  Diagnosis Uterus, ovaries and fallopian tubes, cervix - BENIGN ENDOMETRIUM WITH UNDERLYING MYOMETRIUM DEMONSTRATING LEIOMYOMA FORMATION. - BENIGN CERVIX. - BENIGN BILATERAL OVARIES AND FALLOPIAN TUBES. - NO DYSPLASIA,  ATYPIA, HYPERPLASIA OR MALIGNANCY IDENTIFIED. - SEE COMMENT. Microscopic Comment The diagnosis made previously in the endometrial curettage specimen [id number]) is noted. There is no hyperplasia, atypia or malignancy identified in the current hysterectomy specimen. The entire endometrium is submitted and histologically examined. (RH:kh 03-22-16)  She comes in today for her postoperative check.  She overall feels like she is about 90%. She is back at work. She occasionally will have a small bit of spotting. She gets cramps if she drinks very cold liquids but that has been an issue for her for a long time. She has a slight amount of constipation but is trying to have more fiber to her diet. She otherwise has no pain. She denies a change about bladder habits. She is very pleased with her surgical pathology results.  Current Meds:  Outpatient Encounter Prescriptions as of 04/19/2016  Medication Sig  . ibuprofen (ADVIL,MOTRIN) 600 MG tablet 1 po every 6 hours as needed for  . Multiple Vitamins-Calcium (ONE-A-DAY WOMENS PO) Take 1 tablet by mouth daily.  Marland Kitchen oxyCODONE-acetaminophen (PERCOCET/ROXICET) 5-325 MG tablet Take 1-2 tablets by mouth every 4 (four) hours as needed (moderate to severe pain (when tolerating fluids)).  Marland Kitchen ranitidine (ZANTAC) 150 MG tablet Take 150 mg by mouth daily as needed for heartburn.   No facility-administered encounter medications on file as of 04/19/2016.     Allergy: No Known Allergies  Social Hx:   Social History   Social History  . Marital status: Married    Spouse name: N/A  . Number of children: N/A  . Years of education: N/A   Occupational History  . Not on file.   Social History Main Topics  . Smoking  status: Never Smoker  . Smokeless tobacco: Never Used  . Alcohol use No  . Drug use: No  . Sexual activity: Yes    Birth control/ protection: Post-menopausal   Other Topics Concern  . Not on file   Social History Narrative  . No narrative on  file    Past Surgical Hx:  Past Surgical History:  Procedure Laterality Date  . DILATATION & CURETTAGE/HYSTEROSCOPY WITH MYOSURE N/A 01/12/2016   Procedure: DILATATION & CURETTAGE/HYSTEROSCOPY WITH MYOSURE POLYPECTOMY;  Surgeon: Christophe Louis, MD;  Location: [facility];  Service: Gynecology;  Laterality: N/A;  . NASAL SINUS SURGERY    . ROBOTIC ASSISTED TOTAL HYSTERECTOMY WITH BILATERAL SALPINGO OOPHERECTOMY Bilateral 03/21/2016   Procedure: XI ROBOTIC ASSISTED TOTAL HYSTERECTOMY WITH BILATERAL SALPINGO OOPHORECTOMY;  Surgeon: Nancy Marus, MD;  Location: [facility];  Service: Gynecology;  Laterality: Bilateral;  . TUBAL LIGATION    . WISDOM TOOTH EXTRACTION      Past Medical Hx:  Past Medical History:  Diagnosis Date  . Anemia    history  . GERD (gastroesophageal reflux disease)   . Shingles    history  . Sleep apnea    does not use cpap  . SVD (spontaneous vaginal delivery)    x 1    Oncology Hx:   No history exists.    Family Hx: History reviewed. No pertinent family history.  Vitals:  Blood pressure 120/75, pulse 65, temperature 98.4 F (36.9 C), temperature source Oral, resp. rate 18, height 5\' 6"  (1.676 m), weight 210 lb 11.2 oz (95.6 kg), SpO2 100 %.  Physical Exam: Well-nourished well-developed female in no acute distress.  Abdomen: Soft, nontender, nondistended. Well-healed surgical incisions. There is a small suture still visible in the right lower incision. It was removed without difficulty.  Pelvic: Normal female genitalia. Vagina slightly atrophic. The vaginal cuff is visualized. The suture line is intact. There is no evidence of any bleeding. Bimanual determination reveals no tenderness. The suture line is intact. There is no fluid collection.  Assessment/Plan:  55 year old gravida 1 para 1 with A preoperative diagnosis a complex atypical hyperplasia and fortunately has no residual hyperplasia or endometrial cancer. She's doing very well postoperatively. She'll be  released from our clinic. She knows that we'll be happy to see her in the future should the need arise.   We appreciate the opportunity to partner in the care of this very pleasant patient. Ebony Rozycki A., MD 04/19/2016, 10:39 AM

## 2016-05-08 ENCOUNTER — Telehealth: Payer: Self-pay

## 2016-05-08 NOTE — Telephone Encounter (Signed)
Attempted to return the call to the patient , no answer , left a detailed message with call back number provided.

## 2016-05-08 NOTE — Telephone Encounter (Signed)
Contacted the patient again (10:40 AM ) ,patient states she has been experiencing some "foul" smelling yellow vaginal discharge with some old blood at times. Patient states she changes her pad two times daily . Patient states she had some cramping ,but is has subsided. Patient denies pain ,chills or fever. ( 15:22 ) spoke with the patient to update her Dr Terrence Dupont Rossi's recommendations . Orders received to have the patient come in to be assessed tomorrow Tuesday 05/09/2016 at 10:30 AM by Joylene John, APNP. Patient agreed with the plan , denies further questions at this time.

## 2016-05-09 ENCOUNTER — Ambulatory Visit: Payer: 59 | Attending: Gynecologic Oncology | Admitting: Gynecologic Oncology

## 2016-05-09 ENCOUNTER — Encounter: Payer: Self-pay | Admitting: Gynecologic Oncology

## 2016-05-09 VITALS — BP 137/86 | HR 74 | Temp 98.4°F | Resp 18 | Wt 211.0 lb

## 2016-05-09 DIAGNOSIS — Z9889 Other specified postprocedural states: Secondary | ICD-10-CM | POA: Diagnosis not present

## 2016-05-09 DIAGNOSIS — Z79899 Other long term (current) drug therapy: Secondary | ICD-10-CM | POA: Diagnosis not present

## 2016-05-09 DIAGNOSIS — N852 Hypertrophy of uterus: Secondary | ICD-10-CM | POA: Diagnosis not present

## 2016-05-09 DIAGNOSIS — Z90710 Acquired absence of both cervix and uterus: Secondary | ICD-10-CM | POA: Diagnosis not present

## 2016-05-09 DIAGNOSIS — G4730 Sleep apnea, unspecified: Secondary | ICD-10-CM | POA: Diagnosis not present

## 2016-05-09 DIAGNOSIS — K219 Gastro-esophageal reflux disease without esophagitis: Secondary | ICD-10-CM | POA: Insufficient documentation

## 2016-05-09 DIAGNOSIS — N898 Other specified noninflammatory disorders of vagina: Secondary | ICD-10-CM | POA: Diagnosis not present

## 2016-05-09 MED ORDER — METRONIDAZOLE 500 MG PO TABS: 500.0000 mg | ORAL_TABLET | Freq: Three times a day (TID) | ORAL | 0 refills | Status: AC

## 2016-05-09 NOTE — Patient Instructions (Signed)
Begin taking Flagyl 500 mg three times a day for one week.  Avoid alcohol while taking the flagyl.  Also begin using one capful of Miralax once a day to assist with having more regular bowel movements.  If you need to add a stool softener once a day, you can do that.  The Miralax and the stool softeners can be bought over the counter at your local drug store.  Call if you symptoms persist or worsen.  Metronidazole tablets or capsules What is this medicine? METRONIDAZOLE (me troe NI da zole) is an antiinfective. It is used to treat certain kinds of bacterial and protozoal infections. It will not work for colds, flu, or other viral infections. This medicine may be used for other purposes; ask your health care provider or pharmacist if you have questions. What should I tell my health care provider before I take this medicine? They need to know if you have any of these conditions: -anemia or other blood disorders -disease of the nervous system -fungal or yeast infection -if you drink alcohol containing drinks -liver disease -seizures -an unusual or allergic reaction to metronidazole, or other medicines, foods, dyes, or preservatives -pregnant or trying to get pregnant -breast-feeding How should I use this medicine? Take this medicine by mouth with a full glass of water. Follow the directions on the prescription label. Take your medicine at regular intervals. Do not take your medicine more often than directed. Take all of your medicine as directed even if you think you are better. Do not skip doses or stop your medicine early. Talk to your pediatrician regarding the use of this medicine in children. Special care may be needed. Overdosage: If you think you have taken too much of this medicine contact a poison control center or emergency room at once. NOTE: This medicine is only for you. Do not share this medicine with others. What if I miss a dose? If you miss a dose, take it as soon as you can. If  it is almost time for your next dose, take only that dose. Do not take double or extra doses. What may interact with this medicine? Do not take this medicine with any of the following medications: -alcohol or any product that contains alcohol -amprenavir oral solution -cisapride -disulfiram -dofetilide -dronedarone -paclitaxel injection -pimozide -ritonavir oral solution -sertraline oral solution -sulfamethoxazole-trimethoprim injection -thioridazine -ziprasidone This medicine may also interact with the following medications: -birth control pills -cimetidine -lithium -other medicines that prolong the QT interval (cause an abnormal heart rhythm) -phenobarbital -phenytoin -warfarin This list may not describe all possible interactions. Give your health care provider a list of all the medicines, herbs, non-prescription drugs, or dietary supplements you use. Also tell them if you smoke, drink alcohol, or use illegal drugs. Some items may interact with your medicine. What should I watch for while using this medicine? Tell your doctor or health care professional if your symptoms do not improve or if they get worse. You may get drowsy or dizzy. Do not drive, use machinery, or do anything that needs mental alertness until you know how this medicine affects you. Do not stand or sit up quickly, especially if you are an older patient. This reduces the risk of dizzy or fainting spells. Avoid alcoholic drinks while you are taking this medicine and for three days afterward. Alcohol may make you feel dizzy, sick, or flushed. If you are being treated for a sexually transmitted disease, avoid sexual contact until you have finished your treatment. Your  sexual partner may also need treatment. What side effects may I notice from receiving this medicine? Side effects that you should report to your doctor or health care professional as soon as possible: -allergic reactions like skin rash or hives, swelling  of the face, lips, or tongue -confusion, clumsiness -difficulty speaking -discolored or sore mouth -dizziness -fever, infection -numbness, tingling, pain or weakness in the hands or feet -trouble passing urine or change in the amount of urine -redness, blistering, peeling or loosening of the skin, including inside the mouth -seizures -unusually weak or tired -vaginal irritation, dryness, or discharge Side effects that usually do not require medical attention (report to your doctor or health care professional if they continue or are bothersome): -diarrhea -headache -irritability -metallic taste -nausea -stomach pain or cramps -trouble sleeping This list may not describe all possible side effects. Call your doctor for medical advice about side effects. You may report side effects to FDA at 1-800-FDA-1088. Where should I keep my medicine? Keep out of the reach of children. Store at room temperature below 25 degrees C (77 degrees F). Protect from light. Keep container tightly closed. Throw away any unused medicine after the expiration date. NOTE: This sheet is a summary. It may not cover all possible information. If you have questions about this medicine, talk to your doctor, pharmacist, or health care provider.    2016, Elsevier/Gold Standard. (2013-03-14 14:08:39)  Polyethylene Glycol powder What is this medicine? POLYETHYLENE GLYCOL 3350 (pol ee ETH i leen; GLYE col) powder is a laxative used to treat constipation. It increases the amount of water in the stool. Bowel movements become easier and more frequent. This medicine may be used for other purposes; ask your health care provider or pharmacist if you have questions. What should I tell my health care provider before I take this medicine? They need to know if you have any of these conditions: -a history of blockage of the stomach or intestine -current abdomen distension or pain -difficulty swallowing -diverticulitis, ulcerative  colitis, or other chronic bowel disease -phenylketonuria -an unusual or allergic reaction to polyethylene glycol, other medicines, dyes, or preservatives -pregnant or trying to get pregnant -breast-feeding How should I use this medicine? Take this medicine by mouth. The bottle has a measuring cap that is marked with a line. Pour the powder into the cap up to the marked line (the dose is about 1 heaping tablespoon). Add the powder in the cap to a full glass (4 to 8 ounces or 120 to 240 ml) of water, juice, soda, coffee or tea. Mix the powder well. Drink the solution. Take exactly as directed. Do not take your medicine more often than directed. Talk to your pediatrician regarding the use of this medicine in children. Special care may be needed. Overdosage: If you think you have taken too much of this medicine contact a poison control center or emergency room at once. NOTE: This medicine is only for you. Do not share this medicine with others. What if I miss a dose? If you miss a dose, take it as soon as you can. If it is almost time for your next dose, take only that dose. Do not take double or extra doses. What may interact with this medicine? Interactions are not expected. This list may not describe all possible interactions. Give your health care provider a list of all the medicines, herbs, non-prescription drugs, or dietary supplements you use. Also tell them if you smoke, drink alcohol, or use illegal drugs. Some items  may interact with your medicine. What should I watch for while using this medicine? Do not use for more than 2 weeks without advice from your doctor or health care professional. It can take 2 to 4 days to have a bowel movement and to experience improvement in constipation. See your health care professional for any changes in bowel habits, including constipation, that are severe or last longer than three weeks. Always take this medicine with plenty of water. What side effects may I  notice from receiving this medicine? Side effects that you should report to your doctor or health care professional as soon as possible: -diarrhea -difficulty breathing -itching of the skin, hives, or skin rash -severe bloating, pain, or distension of the stomach -vomiting Side effects that usually do not require medical attention (report to your doctor or health care professional if they continue or are bothersome): -bloating or gas -lower abdominal discomfort or cramps -nausea This list may not describe all possible side effects. Call your doctor for medical advice about side effects. You may report side effects to FDA at 1-800-FDA-1088. Where should I keep my medicine? Keep out of the reach of children. Store between 15 and 30 degrees C (59 and 86 degrees F). Throw away any unused medicine after the expiration date. NOTE: This sheet is a summary. It may not cover all possible information. If you have questions about this medicine, talk to your doctor, pharmacist, or health care provider.    2016, Elsevier/Gold Standard. (2008-03-09 16:50:45)

## 2016-05-09 NOTE — Progress Notes (Signed)
Follow Up Note: Gyn-Onc  Ebony Nichols 55 y.o. female  CC:  Chief Complaint  Patient presents with  . Vaginal Discharge    Follow up    HPI:  Ebony Nichols is a 55 year old female initially seen in consultation at the request of Dr. Christophe Louis for complex atypical hyperplasia.  She reported irregular bleeding starting in January 2017 and underwent a hysteroscopy D&C with polypectomy on 01/12/2016. Her preoperative ultrasound revealed a 6.8 x 4.3 x 3.2 cm uterus. The endometrium was filled with a hypoechoic mass on the anterior wall measuring 1.5 cm. No blood flow was identified. She had 3 small fibroids with the largest being 1.2 cm. The ovaries appeared normal bilaterally. 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   ENCOUNTER DATE: 03/21/2016 Preop Diagnosis: complex hyperplasia with atypica Postoperative Diagnosis: no evidence of hyperplasia or cancer on IOFS. Surgery: Total robotic hysterectomy bilateral salpingo-oophorectomy Operative findings: Normal uterus, cervix and bilateral adnexa. IOFS with no evidence of hyperplasia or cancer.  Diagnosis Uterus, ovaries and fallopian tubes, cervix - BENIGN ENDOMETRIUM WITH UNDERLYING MYOMETRIUM DEMONSTRATING LEIOMYOMA FORMATION. - BENIGN CERVIX. - BENIGN BILATERAL OVARIES AND FALLOPIAN TUBES. - NO DYSPLASIA, ATYPIA, HYPERPLASIA OR MALIGNANCY IDENTIFIED. - SEE COMMENT. Microscopic Comment The diagnosis made previously in the endometrial curettage specimen [id number]) is noted. There is no hyperplasia, atypia or malignancy identified in the current hysterectomy specimen. The entire endometrium is submitted and histologically examined. (RH:kh 03-22-16)  Interval History:  She presents today for evaluation of foul smelling vaginal discharge.  She states she had noticed light brown spotting last week but now the spotting has stopped and she has developed a foul smelling, yellowish, thick discharge.   She reports having the discharge for the past three days.  No other symptoms reported except for "feeling like I am having a baby" when she is getting ready to have a bowel movement.  Reports her stools as soft, with no straining, but reports pelvic pressure before a BM.  She has been trying to increase her fiber but states she may need to add something else to her bowel regimen.  She states she tried to have intercourse once after surgery but states they stopped because it was painful and she developed spotting.  She does not feel she needs any STD testing at this time.  No other concerns voiced.  Review of Systems  Constitutional: Feels well. No fever, chills, early satiety, change in appetite, unintentional weight loss or gain. Cardiovascular: No chest pain, shortness of breath, or edema.  Pulmonary: No cough or wheeze.  Gastrointestinal: No nausea, vomiting, or diarrhea. No bright red blood per rectum or change in bowel movement.  Genitourinary: No frequency, urgency, or dysuria. No vaginal bleeding or discharge.  Musculoskeletal: No myalgia or joint pain. Neurologic: No weakness, numbness, or change in gait.  Psychology: No depression, anxiety, or insomnia.  Current Meds:  Outpatient Encounter Prescriptions as of 05/09/2016  Medication Sig  . ibuprofen (ADVIL,MOTRIN) 600 MG tablet 1 po every 6 hours as needed for  . Multiple Vitamins-Calcium (ONE-A-DAY WOMENS PO) Take 1 tablet by mouth daily.  Marland Kitchen oxyCODONE-acetaminophen (PERCOCET/ROXICET) 5-325 MG tablet Take 1-2 tablets by mouth every 4 (four) hours as needed (moderate to severe pain (when tolerating fluids)).  Marland Kitchen ranitidine (ZANTAC) 150 MG tablet Take 150 mg by mouth daily as needed for heartburn.  . metroNIDAZOLE (FLAGYL) 500 MG tablet Take 1 tablet (500 mg total) by mouth 3 (three) times daily.   No facility-administered encounter  medications on file as of 05/09/2016.     Allergy: No Known Allergies  Social Hx:   Social History    Social History  . Marital status: Married    Spouse name: N/A  . Number of children: N/A  . Years of education: N/A   Occupational History  . Not on file.   Social History Main Topics  . Smoking status: Never Smoker  . Smokeless tobacco: Never Used  . Alcohol use No  . Drug use: No  . Sexual activity: Yes    Birth control/ protection: Post-menopausal   Other Topics Concern  . Not on file   Social History Narrative  . No narrative on file    Past Surgical Hx:  Past Surgical History:  Procedure Laterality Date  . DILATATION & CURETTAGE/HYSTEROSCOPY WITH MYOSURE N/A 01/12/2016   Procedure: DILATATION & CURETTAGE/HYSTEROSCOPY WITH MYOSURE POLYPECTOMY;  Surgeon: Christophe Louis, MD;  Location: [facility];  Service: Gynecology;  Laterality: N/A;  . NASAL SINUS SURGERY    . ROBOTIC ASSISTED TOTAL HYSTERECTOMY WITH BILATERAL SALPINGO OOPHERECTOMY Bilateral 03/21/2016   Procedure: XI ROBOTIC ASSISTED TOTAL HYSTERECTOMY WITH BILATERAL SALPINGO OOPHORECTOMY;  Surgeon: Nancy Marus, MD;  Location: [facility];  Service: Gynecology;  Laterality: Bilateral;  . TUBAL LIGATION    . WISDOM TOOTH EXTRACTION      Past Medical Hx:  Past Medical History:  Diagnosis Date  . Anemia    history  . GERD (gastroesophageal reflux disease)   . Shingles    history  . Sleep apnea    does not use cpap  . SVD (spontaneous vaginal delivery)    x 1    Family Hx: History reviewed. No pertinent family history.  Vitals:  Blood pressure 137/86, pulse 74, temperature 98.4 F (36.9 C), temperature source Oral, resp. rate 18, weight 211 lb (95.7 kg).  Physical Exam:  General: Well developed, well nourished female in no acute distress. Alert and oriented x 3.  Cardiovascular: Regular rate and rhythm. S1 and S2 normal.  Lungs: Clear to auscultation bilaterally. No wheezes/crackles/rhonchi noted.  Abdomen: Abdomen soft, non-tender and obese. Active bowel sounds in all quadrants.  Genitourinary:    Vulva/vagina:  Normal external female genitalia. No lesions.    Urethra: No lesions or masses    Vagina: Yellow, cloudy discharge pooling at the top of the vagina.  Vaginal cuff intact with sutures visible. Mild erythema noted at the cuff. No lesions or bleeding noted.  Extremities: No bilateral cyanosis, edema, or clubbing.   Assessment/Plan: 55 year old preoperative diagnosis a complex atypical hyperplasia with no residual hyperplasia or endometrial cancer on final path after Robotic hysterectomy with BSO.  Plan to begin Flagyl 500 mg TID for one week for mild vaginal cuff cellulitis.  Patient advised to avoid alcohol while taking Flagyl.  She is advised to call our office if symptoms persist or worsen, for any new symptoms, questions, or concerns.  She is advised to incorporate Miralax one capful daily to her bowel regimen and the use of stool softeners also discussed as needed.  She is to call our office with an update.  Reportable signs and symptoms reviewed.   CROSS, MELISSA DEAL, NP 05/09/2016, 12:00 PM

## 2016-05-11 ENCOUNTER — Telehealth: Payer: Self-pay

## 2016-05-11 NOTE — Telephone Encounter (Signed)
Orders received from [facility] to follow up with the patient to see how her vaginal discharge has decreased and if the foul odor has gone as well . Patient contacted , patient stated she was "fine". Patient denied further questions at this time , Joylene John, APNP updated.

## 2016-07-21 ENCOUNTER — Emergency Department (HOSPITAL_COMMUNITY): Payer: Worker's Compensation

## 2016-07-21 ENCOUNTER — Emergency Department (HOSPITAL_COMMUNITY)
Admission: EM | Admit: 2016-07-21 | Discharge: 2016-07-21 | Disposition: A | Discharge status: Home / Self Care | Payer: Worker's Compensation | Attending: Emergency Medicine | Admitting: Emergency Medicine

## 2016-07-21 ENCOUNTER — Encounter (HOSPITAL_COMMUNITY): Payer: Self-pay | Admitting: Emergency Medicine

## 2016-07-21 DIAGNOSIS — Y990 Civilian activity done for income or pay: Secondary | ICD-10-CM | POA: Diagnosis not present

## 2016-07-21 DIAGNOSIS — S60552A Superficial foreign body of left hand, initial encounter: Secondary | ICD-10-CM | POA: Diagnosis present

## 2016-07-21 DIAGNOSIS — W268XXA Contact with other sharp object(s), not elsewhere classified, initial encounter: Secondary | ICD-10-CM | POA: Diagnosis not present

## 2016-07-21 DIAGNOSIS — Y939 Activity, unspecified: Secondary | ICD-10-CM | POA: Insufficient documentation

## 2016-07-21 DIAGNOSIS — M795 Residual foreign body in soft tissue: Secondary | ICD-10-CM

## 2016-07-21 DIAGNOSIS — Z23 Encounter for immunization: Secondary | ICD-10-CM | POA: Insufficient documentation

## 2016-07-21 DIAGNOSIS — Y929 Unspecified place or not applicable: Secondary | ICD-10-CM | POA: Insufficient documentation

## 2016-07-21 MED ORDER — LIDOCAINE-EPINEPHRINE (PF) 2 %-1:200000 IJ SOLN
20.0000 mL | Freq: Once | INTRAMUSCULAR | Status: AC
  Administered 2016-07-21: 20 mL
  Filled 2016-07-21: qty 20

## 2016-07-21 MED ORDER — AMOXICILLIN-POT CLAVULANATE 875-125 MG PO TABS: 1.0000 | ORAL_TABLET | Freq: Two times a day (BID) | ORAL | 0 refills | Status: AC

## 2016-07-21 MED ORDER — TETANUS-DIPHTH-ACELL PERTUSSIS 5-2.5-18.5 LF-MCG/0.5 IM SUSP
0.5000 mL | Freq: Once | INTRAMUSCULAR | Status: AC
  Administered 2016-07-21: 0.5 mL via INTRAMUSCULAR
  Filled 2016-07-21: qty 0.5

## 2016-07-21 NOTE — ED Triage Notes (Signed)
Pt. presents with a metal hook embedded at left hand from a knitting machine at work this evening , no bleeding at arrival .

## 2016-07-21 NOTE — Discharge Instructions (Signed)
Take the antibiotics as prescribed.  Follow up with your doctor. Return to the ED if you develop new or worsening symptoms. °

## 2016-07-21 NOTE — ED Provider Notes (Signed)
[facility] DEPT Provider Note   CSN: [id number] Arrival date & time: 07/21/16  0116     History   Chief Complaint Chief Complaint  Patient presents with  . Hand Injury    Hook Stuck     HPI Ebony Nichols is a 55 y.o. female.  Patient presents with foreign body to left palmar hand. States she was working at a [employer] when the needle became stuck in her left hand around 1 AM. Denies any focal weakness, numbness or tingling. Unknown last tetanus shot.   The history is provided by the patient.  Hand Injury      Past Medical History:  Diagnosis Date  . Anemia    history  . GERD (gastroesophageal reflux disease)   . Shingles    history  . Sleep apnea    does not use cpap  . SVD (spontaneous vaginal delivery)    x 1    Patient Active Problem List   Diagnosis Date Noted  . 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 Postmenopausal bleeding 01/12/2016  . Endometrial mass 01/12/2016  . 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 DILATATION & CURETTAGE/HYSTEROSCOPY WITH MYOSURE N/A 01/12/2016   Procedure: DILATATION & CURETTAGE/HYSTEROSCOPY WITH MYOSURE POLYPECTOMY;  Surgeon: Christophe Louis, MD;  Location: [facility];  Service: Gynecology;  Laterality: N/A;  . NASAL SINUS SURGERY    . ROBOTIC ASSISTED TOTAL HYSTERECTOMY WITH BILATERAL SALPINGO OOPHERECTOMY Bilateral 03/21/2016   Procedure: XI ROBOTIC ASSISTED TOTAL HYSTERECTOMY WITH BILATERAL SALPINGO OOPHORECTOMY;  Surgeon: Nancy Marus, MD;  Location: [facility];  Service: Gynecology;  Laterality: Bilateral;  . TUBAL LIGATION    . WISDOM TOOTH EXTRACTION      OB History    No data available       Home Medications    Prior to Admission medications   Medication Sig Start Date End Date Taking? Authorizing Provider  amoxicillin-clavulanate (AUGMENTIN) 875-125 MG tablet Take 1 tablet by mouth every 12 (twelve) hours. 07/21/16   Ezequiel Essex, MD  ibuprofen  (ADVIL,MOTRIN) 600 MG tablet 1 po every 6 hours as needed for 01/12/16   Christophe Louis, MD  metroNIDAZOLE (FLAGYL) 500 MG tablet Take 1 tablet (500 mg total) by mouth 3 (three) times daily. 05/09/16   Dorothyann Gibbs, NP  Multiple Vitamins-Calcium (ONE-A-DAY WOMENS PO) Take 1 tablet by mouth daily.    Historical Provider, MD  oxyCODONE-acetaminophen (PERCOCET/ROXICET) 5-325 MG tablet Take 1-2 tablets by mouth every 4 (four) hours as needed (moderate to severe pain (when tolerating fluids)). 03/22/16   Dorothyann Gibbs, NP  ranitidine (ZANTAC) 150 MG tablet Take 150 mg by mouth daily as needed for heartburn.    Historical Provider, MD    Family History No family history on file.  Social History Social History  Substance Use Topics  . Smoking status: Never Smoker  . Smokeless tobacco: Never Used  . Alcohol use No     Allergies   Patient has no known allergies.   Review of Systems Review of Systems  Constitutional: Negative for activity change and appetite change.  Respiratory: Negative for cough, chest tightness and shortness of breath.   Cardiovascular: Negative for chest pain.  Gastrointestinal: Negative for abdominal pain, nausea and vomiting.  Genitourinary: Negative for dysuria and hematuria.  Musculoskeletal: Negative for back pain.  Skin: Positive for wound.  Neurological: Negative for dizziness, light-headedness and headaches.   A complete 10 system review of systems was obtained  and all systems are negative except as noted in the HPI and PMH.    Physical Exam Updated Vital Signs BP 127/80   Pulse (!) 59   Temp 97.9 F (36.6 C) (Oral)   Resp 18   SpO2 97%   Physical Exam  Constitutional: She is oriented to person, place, and time. She appears well-developed and well-nourished. No distress.  HENT:  Head: Normocephalic and atraumatic.  Mouth/Throat: Oropharynx is clear and moist. No oropharyngeal exudate.  Eyes: Conjunctivae and EOM are normal. Pupils are equal, round,  and reactive to light.  Neck: Normal range of motion. Neck supple.  No meningismus.  Cardiovascular: Normal rate, regular rhythm, normal heart sounds and intact distal pulses.   No murmur heard. Pulmonary/Chest: Effort normal and breath sounds normal. No respiratory distress.  Abdominal: Soft. There is no tenderness. There is no rebound and no guarding.  Musculoskeletal: Normal range of motion. She exhibits no edema or tenderness.  Knitting needle embedded in palmar left hand near second MCP. Full range of motion of MCP, DIP, PIP Distal sensation and capillary refill intact.  Neurological: She is alert and oriented to person, place, and time. No cranial nerve deficit. She exhibits normal muscle tone. Coordination normal.  No ataxia on finger to nose bilaterally. No pronator drift. 5/5 strength throughout. CN 2-12 intact.Equal grip strength. Sensation intact.   Skin: Skin is warm.  Psychiatric: She has a normal mood and affect. Her behavior is normal.  Nursing note and vitals reviewed.    ED Treatments / Results  Labs (all labs ordered are listed, but only abnormal results are displayed) Labs Reviewed - No data to display  EKG  EKG Interpretation None       Radiology Dg Hand Complete Left  Result Date: 07/21/2016 CLINICAL DATA:  Injury, hook stuck in left hand EXAM: LEFT HAND - COMPLETE 3+ VIEW COMPARISON:  None. FINDINGS: No fracture or subluxation is seen. Sharp ended instrument overlies the second and third metacarpal heads. IMPRESSION: Sharp ended metallic instrument within the soft tissues of the radial aspect of the hand. No fracture is seen. Electronically Signed   By: Donavan Foil M.D.   On: 07/21/2016 02:07    Procedures .Foreign Body Removal Date/Time: 07/21/2016 5:09 AM Performed by: Ezequiel Essex Authorized by: Ezequiel Essex  Consent: Verbal consent obtained. Risks and benefits: risks, benefits and alternatives were discussed Consent given by:  patient Patient understanding: patient states understanding of the procedure being performed Patient identity confirmed: verbally with patient Time out: Immediately prior to procedure a "time out" was called to verify the correct patient, procedure, equipment, support staff and site/side marked as required. Body area: skin General location: upper extremity Location details: left hand Anesthesia: local infiltration  Anesthesia: Local Anesthetic: lidocaine 2% with epinephrine Anesthetic total: 3 mL  Sedation: Patient sedated: no Patient restrained: no Patient cooperative: yes Localization method: serial x-rays, visualized and probed Removal mechanism: hemostat Dressing: antibiotic ointment Tendon involvement: none Depth: deep Complexity: simple 1 objects recovered. Objects recovered: knitting needle Post-procedure assessment: foreign body removed Patient tolerance: Patient tolerated the procedure well with no immediate complications Comments: Barb capped with 18 gauge needle and FB removed.    (including critical care time)  Medications Ordered in ED Medications  lidocaine-EPINEPHrine (XYLOCAINE W/EPI) 2 %-1:200000 (PF) injection 20 mL (20 mLs Infiltration Given 07/21/16 0519)  Tdap (BOOSTRIX) injection 0.5 mL (0.5 mLs Intramuscular Given 07/21/16 0519)     Initial Impression / Assessment and Plan / ED Course  I  have reviewed the triage vital signs and the nursing notes.  Pertinent labs & imaging results that were available during my care of the patient were reviewed by me and considered in my medical decision making (see chart for details).  Clinical Course    Foreign body to left hand. Neurovascular intact. No fractures.  Tetanus updated. Foreign body removed as above.  Prophylactic antibiotics given. Wound dressed. Follow-up with PCP. Return precautions discussed.  Final Clinical Impressions(s) / ED Diagnoses   Final diagnoses:  Foreign body (FB) in soft tissue     New Prescriptions Discharge Medication List as of 07/21/2016  5:23 AM    START taking these medications   Details  amoxicillin-clavulanate (AUGMENTIN) 875-125 MG tablet Take 1 tablet by mouth every 12 (twelve) hours., Starting Fri 07/21/2016, Print         Ezequiel Essex, MD 07/21/16 [phone]

## 2016-07-21 NOTE — ED Notes (Signed)
Pt verbalized understanding of d/c instructions and has no further questions. Pt is stable, A&Ox4, VSS.  

## 2016-08-21 ENCOUNTER — Encounter (HOSPITAL_COMMUNITY): Payer: Self-pay | Admitting: Emergency Medicine

## 2016-08-21 ENCOUNTER — Emergency Department (HOSPITAL_COMMUNITY)
Admission: EM | Admit: 2016-08-21 | Discharge: 2016-08-21 | Disposition: A | Discharge status: Home / Self Care | Payer: 59 | Attending: Emergency Medicine | Admitting: Emergency Medicine

## 2016-08-21 DIAGNOSIS — Z79899 Other long term (current) drug therapy: Secondary | ICD-10-CM | POA: Insufficient documentation

## 2016-08-21 DIAGNOSIS — R69 Illness, unspecified: Secondary | ICD-10-CM

## 2016-08-21 DIAGNOSIS — J029 Acute pharyngitis, unspecified: Secondary | ICD-10-CM | POA: Insufficient documentation

## 2016-08-21 DIAGNOSIS — J111 Influenza due to unidentified influenza virus with other respiratory manifestations: Secondary | ICD-10-CM

## 2016-08-21 MED ORDER — BENZONATATE 100 MG PO CAPS: 100.0000 mg | ORAL_CAPSULE | Freq: Three times a day (TID) | ORAL | 0 refills | Status: DC

## 2016-08-21 MED ORDER — ACETAMINOPHEN 500 MG PO TABS
1000.0000 mg | ORAL_TABLET | Freq: Once | ORAL | Status: AC
  Administered 2016-08-21: 1000 mg via ORAL
  Filled 2016-08-21: qty 2

## 2016-08-21 MED ORDER — IBUPROFEN 800 MG PO TABS
800.0000 mg | ORAL_TABLET | Freq: Once | ORAL | Status: AC
  Administered 2016-08-21: 800 mg via ORAL
  Filled 2016-08-21: qty 1

## 2016-08-21 MED ORDER — BENZONATATE 100 MG PO CAPS
100.0000 mg | ORAL_CAPSULE | Freq: Once | ORAL | Status: AC
  Administered 2016-08-21: 100 mg via ORAL
  Filled 2016-08-21: qty 1

## 2016-08-21 NOTE — ED Provider Notes (Signed)
[facility] DEPT Provider Note   CSN: [id number] Arrival date & time: 08/21/16  0508     History   Chief Complaint Chief Complaint  Patient presents with  . Sore Throat    HPI Ebony Nichols is a 56 y.o. female.  56 yo F with a chief complaint of cough congestion fevers chills and myalgias. Going on for the past 4 days. Having a sore throat as well. Denies nausea or vomiting denies diarrhea. Denies sick contacts.   The history is provided by the patient.  Sore Throat  Pertinent negatives include no chest pain, no headaches and no shortness of breath.  Illness  This is a new problem. The current episode started more than 2 days ago. The problem occurs constantly. The problem has been gradually worsening. Pertinent negatives include no chest pain, no headaches and no shortness of breath. Nothing aggravates the symptoms. Nothing relieves the symptoms. She has tried nothing for the symptoms. The treatment provided no relief.    Past Medical History:  Diagnosis Date  . Anemia    history  . GERD (gastroesophageal reflux disease)   . Shingles    history  . Sleep apnea    does not use cpap  . SVD (spontaneous vaginal delivery)    x 1    Patient Active Problem List   Diagnosis Date Noted  . 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 Postmenopausal bleeding 01/12/2016  . Endometrial mass 01/12/2016  . 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 DILATATION & CURETTAGE/HYSTEROSCOPY WITH MYOSURE N/A 01/12/2016   Procedure: DILATATION & CURETTAGE/HYSTEROSCOPY WITH MYOSURE POLYPECTOMY;  Surgeon: Christophe Louis, MD;  Location: [facility];  Service: Gynecology;  Laterality: N/A;  . NASAL SINUS SURGERY    . ROBOTIC ASSISTED TOTAL HYSTERECTOMY WITH BILATERAL SALPINGO OOPHERECTOMY Bilateral 03/21/2016   Procedure: XI ROBOTIC ASSISTED TOTAL HYSTERECTOMY WITH BILATERAL SALPINGO OOPHORECTOMY;  Surgeon: Nancy Marus, MD;  Location: [facility];   Service: Gynecology;  Laterality: Bilateral;  . TUBAL LIGATION    . WISDOM TOOTH EXTRACTION      OB History    No data available       Home Medications    Prior to Admission medications   Medication Sig Start Date End Date Taking? Authorizing Provider  amoxicillin-clavulanate (AUGMENTIN) 875-125 MG tablet Take 1 tablet by mouth every 12 (twelve) hours. 07/21/16   Ezequiel Essex, MD  ibuprofen (ADVIL,MOTRIN) 600 MG tablet 1 po every 6 hours as needed for 01/12/16   Christophe Louis, MD  metroNIDAZOLE (FLAGYL) 500 MG tablet Take 1 tablet (500 mg total) by mouth 3 (three) times daily. 05/09/16   Dorothyann Gibbs, NP  Multiple Vitamins-Calcium (ONE-A-DAY WOMENS PO) Take 1 tablet by mouth daily.    Historical Provider, MD  oxyCODONE-acetaminophen (PERCOCET/ROXICET) 5-325 MG tablet Take 1-2 tablets by mouth every 4 (four) hours as needed (moderate to severe pain (when tolerating fluids)). 03/22/16   Dorothyann Gibbs, NP  ranitidine (ZANTAC) 150 MG tablet Take 150 mg by mouth daily as needed for heartburn.    Historical Provider, MD    Family History History reviewed. No pertinent family history.  Social History Social History  Substance Use Topics  . Smoking status: Never Smoker  . Smokeless tobacco: Never Used  . Alcohol use No     Allergies   Patient has no known allergies.   Review of Systems Review of Systems  Constitutional: Positive for chills and fever.  HENT: Positive  for congestion. Negative for rhinorrhea.   Eyes: Negative for redness and visual disturbance.  Respiratory: Positive for cough. Negative for shortness of breath and wheezing.   Cardiovascular: Negative for chest pain and palpitations.  Gastrointestinal: Negative for nausea and vomiting.  Genitourinary: Negative for dysuria and urgency.  Musculoskeletal: Positive for myalgias. Negative for arthralgias.  Skin: Negative for pallor and wound.  Neurological: Negative for dizziness and headaches.     Physical  Exam Updated Vital Signs BP 111/73 (BP Location: Right Arm)   Pulse 104   Temp 98.8 F (37.1 C) (Oral)   Resp 16   Ht 5\' 6"  (1.676 m)   Wt 222 lb (100.7 kg)   SpO2 97%   BMI 35.83 kg/m   Physical Exam  Constitutional: She is oriented to person, place, and time. She appears well-developed and well-nourished. No distress.  HENT:  Head: Normocephalic and atraumatic.  Swollen turbinates, posterior nasal drip, no noted sinus ttp, tm normal bilaterally.    Eyes: EOM are normal. Pupils are equal, round, and reactive to light.  Neck: Normal range of motion. Neck supple.  Cardiovascular: Normal rate and regular rhythm.  Exam reveals no gallop and no friction rub.   No murmur heard. Pulmonary/Chest: Effort normal. She has no wheezes. She has no rales.  Abdominal: Soft. She exhibits no distension. There is no tenderness.  Musculoskeletal: She exhibits no edema or tenderness.  Neurological: She is alert and oriented to person, place, and time.  Skin: Skin is warm and dry. She is not diaphoretic.  Psychiatric: She has a normal mood and affect. Her behavior is normal.  Nursing note and vitals reviewed.    ED Treatments / Results  Labs (all labs ordered are listed, but only abnormal results are displayed) Labs Reviewed - No data to display  EKG  EKG Interpretation None       Radiology No results found.  Procedures Procedures (including critical care time)  Medications Ordered in ED Medications  acetaminophen (TYLENOL) tablet 1,000 mg (1,000 mg Oral Given 08/21/16 0542)  ibuprofen (ADVIL,MOTRIN) tablet 800 mg (800 mg Oral Given 08/21/16 0542)  benzonatate (TESSALON) capsule 100 mg (100 mg Oral Given 08/21/16 0542)     Initial Impression / Assessment and Plan / ED Course  I have reviewed the triage vital signs and the nursing notes.  Pertinent labs & imaging results that were available during my care of the patient were reviewed by me and considered in my medical decision  making (see chart for details).  Clinical Course     56 yo F With a chief complaint of an influenza-like illness. On my exam she is well-appearing and nontoxic. Clear lung sounds. I doubt pneumonia or other bacterial source of infection. While the patient treat symptomatically. PCP follow-up.  5:43 AM:  I have discussed the diagnosis/risks/treatment options with the patient and believe the pt to be eligible for discharge home to follow-up with PCP. We also discussed returning to the ED immediately if new or worsening sx occur. We discussed the sx which are most concerning (e.g., sudden worsening pain, fever, inability to tolerate by mouth) that necessitate immediate return. Medications administered to the patient during their visit and any new prescriptions provided to the patient are listed below.  Medications given during this visit Medications  acetaminophen (TYLENOL) tablet 1,000 mg (1,000 mg Oral Given 08/21/16 0542)  ibuprofen (ADVIL,MOTRIN) tablet 800 mg (800 mg Oral Given 08/21/16 0542)  benzonatate (TESSALON) capsule 100 mg (100 mg Oral Given 08/21/16  0542)     The patient appears reasonably screen and/or stabilized for discharge and I doubt any other medical condition or other [facility] requiring further screening, evaluation, or treatment in the ED at this time prior to discharge.    Final Clinical Impressions(s) / ED Diagnoses   Final diagnoses:  Influenza-like illness    New Prescriptions New Prescriptions   No medications on file     Deno Etienne, DO 08/21/16 [id number]

## 2016-08-21 NOTE — ED Notes (Signed)
ED Provider at bedside. 

## 2016-08-21 NOTE — ED Triage Notes (Signed)
Pt states that she has had a sore throat for around 3 days. States she has problems with acid reflux. Has a productive cough with yellow and blood tinged phlegm. Denies fevers, vomiting and diarrhea. Vitals stable

## 2016-08-21 NOTE — Discharge Instructions (Signed)
Take tylenol 2 pills 4 times a day and motrin 4 pills 3 times a day.  Drink plenty of fluids.  Return for worsening shortness of breath, headache, confusion. Follow up with your family doctor.   

## 2016-09-26 ENCOUNTER — Other Ambulatory Visit: Payer: Self-pay | Admitting: Family Medicine

## 2016-09-26 DIAGNOSIS — Z1231 Encounter for screening mammogram for malignant neoplasm of breast: Secondary | ICD-10-CM

## 2016-10-23 ENCOUNTER — Ambulatory Visit
Admission: RE | Admit: 2016-10-23 | Discharge: 2016-10-23 | Disposition: A | Discharge status: Home / Self Care | Payer: 59 | Source: Ambulatory Visit | Attending: Family Medicine | Admitting: Family Medicine

## 2016-10-23 DIAGNOSIS — Z1231 Encounter for screening mammogram for malignant neoplasm of breast: Secondary | ICD-10-CM

## 2016-10-31 ENCOUNTER — Emergency Department (HOSPITAL_COMMUNITY)
Admission: EM | Admit: 2016-10-31 | Discharge: 2016-10-31 | Disposition: A | Discharge status: Home / Self Care | Payer: 59 | Attending: Emergency Medicine | Admitting: Emergency Medicine

## 2016-10-31 ENCOUNTER — Encounter (HOSPITAL_COMMUNITY): Payer: Self-pay

## 2016-10-31 DIAGNOSIS — Z79899 Other long term (current) drug therapy: Secondary | ICD-10-CM | POA: Insufficient documentation

## 2016-10-31 DIAGNOSIS — L03012 Cellulitis of left finger: Secondary | ICD-10-CM | POA: Diagnosis not present

## 2016-10-31 DIAGNOSIS — M79645 Pain in left finger(s): Secondary | ICD-10-CM | POA: Diagnosis present

## 2016-10-31 MED ORDER — BACITRACIN ZINC 500 UNIT/GM EX OINT: 1.0000 "application " | TOPICAL_OINTMENT | Freq: Three times a day (TID) | CUTANEOUS | 0 refills | Status: AC

## 2016-10-31 NOTE — Discharge Instructions (Signed)
Apply your antibiotic ointment to your finger 3 times daily for the next 7 days. Continue to soak your hand for 15-20 minutes 2-3 times daily and dry off your hand completely in between soaks.  Follow up with your primary care provider in 3-4 days as needed if your symptoms have not improved.  Please return to the Emergency Department if symptoms worsen or new onset of fever, redness, swelling, warmth, drainage, decreased range of motion of finger.

## 2016-10-31 NOTE — Discharge Planning (Signed)
Pt up for discharge. EDCM reviewed chart for possible CM needs.  No needs identified or communicated.  

## 2016-10-31 NOTE — ED Notes (Signed)
Pt would like RN to come in so she can go.

## 2016-10-31 NOTE — ED Triage Notes (Signed)
Per Pt, Pt reports pulling hang nail off two weeks ago and noted that her finger started to have a puss filled blister grow at the site with some swelling.

## 2016-10-31 NOTE — ED Provider Notes (Signed)
[facility] DEPT Provider Note   CSN: [id number] Arrival date & time: 10/31/16  4496     History   Chief Complaint Chief Complaint  Patient presents with  . Wound Infection    HPI Ebony Nichols is a 56 y.o. female.  HPI   Patient is a 56 year old female with history of GERD and anemia who presents the ED with complaint of left middle finger pain. Patient reports pulling hangnail on her left middle finger over week ago and states since then she has been having associated pain and swelling. Patient states she has been soaking her finger in Epsom salt over the past 4 or 5 days. She states a few days ago she began noticing a pus filled blister to the side of her nail where she pulled a hangnail. Patient states her pain, swelling and redness have significantly improved but notes she has continued to have mild discomfort to her finger. Denies fever, rash, numbness, tingling, weakness. Patient denies any recent use of antibiotics. Denies any recent injury to her finger.  Past Medical History:  Diagnosis Date  . Anemia    history  . GERD (gastroesophageal reflux disease)   . Shingles    history  . Sleep apnea    does not use cpap  . SVD (spontaneous vaginal delivery)    x 1    Patient Active Problem List   Diagnosis Date Noted  . 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 Postmenopausal bleeding 01/12/2016  . Endometrial mass 01/12/2016  . 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 DILATATION & CURETTAGE/HYSTEROSCOPY WITH MYOSURE N/A 01/12/2016   Procedure: DILATATION & CURETTAGE/HYSTEROSCOPY WITH MYOSURE POLYPECTOMY;  Surgeon: Christophe Louis, MD;  Location: [facility];  Service: Gynecology;  Laterality: N/A;  . NASAL SINUS SURGERY    . ROBOTIC ASSISTED TOTAL HYSTERECTOMY WITH BILATERAL SALPINGO OOPHERECTOMY Bilateral 03/21/2016   Procedure: XI ROBOTIC ASSISTED TOTAL HYSTERECTOMY WITH BILATERAL SALPINGO OOPHORECTOMY;  Surgeon:  Nancy Marus, MD;  Location: [facility];  Service: Gynecology;  Laterality: Bilateral;  . TUBAL LIGATION    . WISDOM TOOTH EXTRACTION      OB History    No data available       Home Medications    Prior to Admission medications   Medication Sig Start Date End Date Taking? Authorizing Provider  amoxicillin-clavulanate (AUGMENTIN) 875-125 MG tablet Take 1 tablet by mouth every 12 (twelve) hours. 07/21/16   Ezequiel Essex, MD  bacitracin ointment Apply 1 application topically 3 (three) times daily. 10/31/16 11/07/16  Nona Dell, PA-C  benzonatate (TESSALON) 100 MG capsule Take 1 capsule (100 mg total) by mouth every 8 (eight) hours. 08/21/16   Deno Etienne, DO  ibuprofen (ADVIL,MOTRIN) 600 MG tablet 1 po every 6 hours as needed for 01/12/16   Christophe Louis, MD  metroNIDAZOLE (FLAGYL) 500 MG tablet Take 1 tablet (500 mg total) by mouth 3 (three) times daily. 05/09/16   Dorothyann Gibbs, NP  Multiple Vitamins-Calcium (ONE-A-DAY WOMENS PO) Take 1 tablet by mouth daily.    Historical Provider, MD  oxyCODONE-acetaminophen (PERCOCET/ROXICET) 5-325 MG tablet Take 1-2 tablets by mouth every 4 (four) hours as needed (moderate to severe pain (when tolerating fluids)). 03/22/16   Dorothyann Gibbs, NP  ranitidine (ZANTAC) 150 MG tablet Take 150 mg by mouth daily as needed for heartburn.    Historical Provider, MD    Family History No family history on file.  Social History Social History  Substance Use Topics  . Smoking status: Never Smoker  . Smokeless tobacco: Never Used  . Alcohol use No     Allergies   Patient has no known allergies.   Review of Systems Review of Systems  Constitutional: Negative for fever.  Skin: Positive for wound (left middle finger). Negative for rash.  Neurological: Negative for weakness and numbness.     Physical Exam Updated Vital Signs BP 126/86 (BP Location: Left Arm)   Pulse 84   Temp 98.5 F (36.9 C) (Oral)   Resp 20   Ht 5\' 5"  (1.651 m)   Wt 99.8 kg    SpO2 97%   BMI 36.61 kg/m   Physical Exam  Constitutional: She is oriented to person, place, and time. She appears well-developed and well-nourished.  HENT:  Head: Normocephalic and atraumatic.  Eyes: Conjunctivae and EOM are normal. Right eye exhibits no discharge. Left eye exhibits no discharge. No scleral icterus.  Neck: Neck supple.  Cardiovascular: Normal rate and intact distal pulses.   Pulmonary/Chest: Effort normal.  Musculoskeletal: Normal range of motion. She exhibits no tenderness or deformity.  Full range of motion of left hand with 5 out of 5 strength. 2+ radial pulse. Cap refill less than 2. Sensation grossly intact. No swelling, erythema or tenderness present to left middle finger.  Neurological: She is alert and oriented to person, place, and time.  Skin: Skin is warm and dry. Capillary refill takes less than 2 seconds.  0.5cm bulla present to left 3rd finger at lateral nailbed filled with purulent fluid. No drainage able to be expressed with palpation. No surrounding swelling, erythema or warmth.  Nursing note and vitals reviewed.    ED Treatments / Results  Labs (all labs ordered are listed, but only abnormal results are displayed) Labs Reviewed - No data to display  EKG  EKG Interpretation None       Radiology No results found.  Procedures .Marland KitchenIncision and Drainage Date/Time: 10/31/2016 8:52 AM Performed by: Nona Dell Authorized by: Nona Dell   Consent:    Consent obtained:  Verbal   Consent given by:  Patient Location:    Indications for incision and drainage: Paronychia.   Size:  1cm   Location: left middle finger. Pre-procedure details:    Skin preparation:  Betadine Anesthesia (see MAR for exact dosages):    Anesthesia method:  None Procedure type:    Complexity:  Simple Procedure details:    Incision types:  Single straight   Incision depth:  Dermal   Scalpel blade:  11   Drainage:  Purulent and bloody    Drainage amount:  Scant   Wound treatment:  Wound left open   Packing materials:  None Post-procedure details:    Patient tolerance of procedure:  Tolerated well, no immediate complications   (including critical care time)  Medications Ordered in ED Medications - No data to display   Initial Impression / Assessment and Plan / ED Course  I have reviewed the triage vital signs and the nursing notes.  Pertinent labs & imaging results that were available during my care of the patient were reviewed by me and considered in my medical decision making (see chart for details).     Patient presents with infection to left middle finger s/p pulling hang nail last week. Denies fever, drainage, redness or swelling. No evidence of felon on exam. VSS. On exam right upper extremity neurovascularly intact. Small purulent filled bulla present to lateral nail fold of  left middle finger consistent with paronychia. I&D performed in the ED without any complications. Plan to discharge patient home with antibiotic ointment and discussed wound care. Advised patient to follow up with PCP as needed for wound recheck. Discussed return precautions.  Final Clinical Impressions(s) / ED Diagnoses   Final diagnoses:  Paronychia of finger, left    New Prescriptions New Prescriptions   BACITRACIN OINTMENT    Apply 1 application topically 3 (three) times daily.     Chesley Noon [place], [place] 10/31/16 [place], DO 10/31/16 [phone]

## 2017-01-01 ENCOUNTER — Emergency Department (HOSPITAL_COMMUNITY)
Admission: EM | Admit: 2017-01-01 | Discharge: 2017-01-01 | Disposition: A | Discharge status: Home / Self Care | Payer: 59 | Attending: Emergency Medicine | Admitting: Emergency Medicine

## 2017-01-01 ENCOUNTER — Encounter (HOSPITAL_COMMUNITY): Payer: Self-pay | Admitting: Emergency Medicine

## 2017-01-01 DIAGNOSIS — M67431 Ganglion, right wrist: Secondary | ICD-10-CM

## 2017-01-01 DIAGNOSIS — M7989 Other specified soft tissue disorders: Secondary | ICD-10-CM | POA: Diagnosis present

## 2017-01-01 NOTE — Discharge Instructions (Signed)
Please continue to wrap your hand If your hand becomes more painful, please follow up with your doctor

## 2017-01-01 NOTE — ED Triage Notes (Addendum)
Pt reports just wants to have her right hand checked out. Pt thinks she may have hurt it while working. Pt knits fabrics. Pt reports that she is normally left handed but has been using her right hand more often, Pain is relieved with brace she has on currently. Pt denies history of high blood pressure, BP in triage 142/104

## 2017-01-01 NOTE — ED Provider Notes (Signed)
[facility] DEPT Provider Note   CSN: [id number] Arrival date & time: 01/01/17  8119     History   Chief Complaint Chief Complaint  Patient presents with  . Hand Injury    HPI Ebony Nichols is a 56 y.o. female who presents with right hand pain and swelling. 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 She's had a swollen lump on the dorsal aspect of her right wrist for "a while" however over the past couple days it has become more swollen and painful. She has been wrapping her hand with significant relief. She presents today because she wants to get checked out. No trauma, weakness, numbness, or tingling.  HPI  Past Medical History:  Diagnosis Date  . Anemia    history  . GERD (gastroesophageal reflux disease)   . Shingles    history  . Sleep apnea    does not use cpap  . SVD (spontaneous vaginal delivery)    x 1    Patient Active Problem List   Diagnosis Date Noted  . 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 Postmenopausal bleeding 01/12/2016  . Endometrial mass 01/12/2016  . 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 DILATATION & CURETTAGE/HYSTEROSCOPY WITH MYOSURE N/A 01/12/2016   Procedure: DILATATION & CURETTAGE/HYSTEROSCOPY WITH MYOSURE POLYPECTOMY;  Surgeon: Christophe Louis, MD;  Location: [facility];  Service: Gynecology;  Laterality: N/A;  . NASAL SINUS SURGERY    . ROBOTIC ASSISTED TOTAL HYSTERECTOMY WITH BILATERAL SALPINGO OOPHERECTOMY Bilateral 03/21/2016   Procedure: XI ROBOTIC ASSISTED TOTAL HYSTERECTOMY WITH BILATERAL SALPINGO OOPHORECTOMY;  Surgeon: Nancy Marus, MD;  Location: [facility];  Service: Gynecology;  Laterality: Bilateral;  . TUBAL LIGATION    . WISDOM TOOTH EXTRACTION      OB History    No data available       Home Medications    Prior to Admission medications   Medication Sig Start Date End Date Taking? Authorizing Provider  amoxicillin-clavulanate  (AUGMENTIN) 875-125 MG tablet Take 1 tablet by mouth every 12 (twelve) hours. 07/21/16   Rancour, Annie Main, MD  benzonatate (TESSALON) 100 MG capsule Take 1 capsule (100 mg total) by mouth every 8 (eight) hours. 08/21/16   Deno Etienne, DO  ibuprofen (ADVIL,MOTRIN) 600 MG tablet 1 po every 6 hours as needed for 01/12/16   Christophe Louis, MD  metroNIDAZOLE (FLAGYL) 500 MG tablet Take 1 tablet (500 mg total) by mouth 3 (three) times daily. 05/09/16   Joylene John D, NP  Multiple Vitamins-Calcium (ONE-A-DAY WOMENS PO) Take 1 tablet by mouth daily.    [provider]  oxyCODONE-acetaminophen (PERCOCET/ROXICET) 5-325 MG tablet Take 1-2 tablets by mouth every 4 (four) hours as needed (moderate to severe pain (when tolerating fluids)). 03/22/16   Joylene John D, NP  ranitidine (ZANTAC) 150 MG tablet Take 150 mg by mouth daily as needed for heartburn.    [provider]    Family History No family history on file.  Social History Social History  Substance Use Topics  . Smoking status: Never Smoker  . Smokeless tobacco: Never Used  . Alcohol use No     Allergies   Patient has no known allergies.   Review of Systems Review of Systems  Musculoskeletal: Positive for arthralgias and joint swelling.  Skin: Negative for wound.       +lump  Neurological: Negative for weakness and numbness.     Physical Exam Updated Vital Signs  BP (!) 136/101 (BP Location: Right Arm)   Pulse 70   Temp 97.9 F (36.6 C) (Oral)   Resp 16   Ht 5\' 6"  (1.676 m)   Wt 100.2 kg   SpO2 100%   BMI 35.67 kg/m   Physical Exam  Constitutional: She is oriented to person, place, and time. She appears well-developed and well-nourished. No distress.  HENT:  Head: Normocephalic and atraumatic.  Eyes: Conjunctivae are normal. Pupils are equal, round, and reactive to light. Right eye exhibits no discharge. Left eye exhibits no discharge. No scleral icterus.  Neck: Normal range of motion.  Cardiovascular:  Normal rate.   Pulmonary/Chest: Effort normal. No respiratory distress.  Abdominal: She exhibits no distension.  Musculoskeletal:  Right hand: Round nodule over dorsal aspect of wrist. Minimal tenderrness to palpation. FROM of wrist and fingers. N/V intact.   Neurological: She is alert and oriented to person, place, and time.  Skin: Skin is warm and dry.  Psychiatric: She has a normal mood and affect. Her behavior is normal.  Nursing note and vitals reviewed.    ED Treatments / Results  Labs (all labs ordered are listed, but only abnormal results are displayed) Labs Reviewed - No data to display  EKG  EKG Interpretation None       Radiology No results found.  Procedures Procedures (including critical care time)  Medications Ordered in ED Medications - No data to display   Initial Impression / Assessment and Plan / ED Course  I have reviewed the triage vital signs and the nursing notes.  Pertinent labs & imaging results that were available during my care of the patient were reviewed by me and considered in my medical decision making (see chart for details).  56 year old female with a ganglion cyst in the right hand. She is getting relief with bracing at this time. Advised follow-up with her PCP if her symptoms continue to be bothersome. Return precautions given.  Final Clinical Impressions(s) / ED Diagnoses   Final diagnoses:  Ganglion cyst of dorsum of right wrist    New Prescriptions New Prescriptions   No medications on file     Iris Pert 01/01/17 0758    Forde Dandy, MD 01/01/17 [phone]

## 2017-10-02 ENCOUNTER — Emergency Department (HOSPITAL_COMMUNITY)
Admission: EM | Admit: 2017-10-02 | Discharge: 2017-10-02 | Disposition: A | Discharge status: Home / Self Care | Payer: 59 | Attending: Emergency Medicine | Admitting: Emergency Medicine

## 2017-10-02 ENCOUNTER — Emergency Department (HOSPITAL_COMMUNITY): Payer: 59

## 2017-10-02 ENCOUNTER — Encounter (HOSPITAL_COMMUNITY): Payer: Self-pay | Admitting: Emergency Medicine

## 2017-10-02 DIAGNOSIS — Z79899 Other long term (current) drug therapy: Secondary | ICD-10-CM | POA: Insufficient documentation

## 2017-10-02 DIAGNOSIS — R110 Nausea: Secondary | ICD-10-CM | POA: Insufficient documentation

## 2017-10-02 DIAGNOSIS — R05 Cough: Secondary | ICD-10-CM | POA: Diagnosis present

## 2017-10-02 DIAGNOSIS — R059 Cough, unspecified: Secondary | ICD-10-CM

## 2017-10-02 MED ORDER — GUAIFENESIN-DM 100-10 MG/5ML PO SYRP: 5.0000 mL | ORAL_SOLUTION | Freq: Three times a day (TID) | ORAL | 0 refills | Status: DC | PRN

## 2017-10-02 MED ORDER — ALBUTEROL SULFATE HFA 108 (90 BASE) MCG/ACT IN AERS
2.0000 | INHALATION_SPRAY | RESPIRATORY_TRACT | Status: DC | PRN
  Administered 2017-10-02: 2 via RESPIRATORY_TRACT
  Filled 2017-10-02: qty 6.7

## 2017-10-02 NOTE — ED Provider Notes (Signed)
[facility] EMERGENCY DEPARTMENT Provider Note   CSN: [id number] Arrival date & time: 10/02/17  0720     History   Chief Complaint Chief Complaint  Patient presents with  . Cough    HPI Ebony Nichols is a 57 y.o. female.  HPI Patient presents with cough.  States she works at [employer] and thinks it could be from the vent blowing maturity air on her.  States she is better right before she goes in the work and when she gets into work she has coughing.  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  No fevers.  No real production.  States she sometimes coughs until she feels nauseous.  No fevers.  No weight loss.  No swelling in her legs.  No history of asthma.  States this began around a month ago. Past Medical History:  Diagnosis Date  . Anemia    history  . GERD (gastroesophageal reflux disease)   . Shingles    history  . Sleep apnea    does not use cpap  . SVD (spontaneous vaginal delivery)    x 1    Patient Active Problem List   Diagnosis Date Noted  . 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 Postmenopausal bleeding 01/12/2016  . Endometrial mass 01/12/2016  . 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 DILATATION & CURETTAGE/HYSTEROSCOPY WITH MYOSURE N/A 01/12/2016   Procedure: DILATATION & CURETTAGE/HYSTEROSCOPY WITH MYOSURE POLYPECTOMY;  Surgeon: Christophe Louis, MD;  Location: [facility];  Service: Gynecology;  Laterality: N/A;  . NASAL SINUS SURGERY    . ROBOTIC ASSISTED TOTAL HYSTERECTOMY WITH BILATERAL SALPINGO OOPHERECTOMY Bilateral 03/21/2016   Procedure: XI ROBOTIC ASSISTED TOTAL HYSTERECTOMY WITH BILATERAL SALPINGO OOPHORECTOMY;  Surgeon: Nancy Marus, MD;  Location: [facility];  Service: Gynecology;  Laterality: Bilateral;  . TUBAL LIGATION    . WISDOM TOOTH EXTRACTION      OB History    No data available       Home Medications    Prior to Admission medications     Medication Sig Start Date End Date Taking? Authorizing Provider  amoxicillin-clavulanate (AUGMENTIN) 875-125 MG tablet Take 1 tablet by mouth every 12 (twelve) hours. 07/21/16   Rancour, Annie Main, MD  benzonatate (TESSALON) 100 MG capsule Take 1 capsule (100 mg total) by mouth every 8 (eight) hours. 08/21/16   Deno Etienne, DO  guaiFENesin-dextromethorphan (ROBITUSSIN DM) 100-10 MG/5ML syrup Take 5 mLs by mouth 3 (three) times daily as needed for cough. 10/02/17   Davonna Belling, MD  ibuprofen (ADVIL,MOTRIN) 600 MG tablet 1 po every 6 hours as needed for 01/12/16   Christophe Louis, MD  metroNIDAZOLE (FLAGYL) 500 MG tablet Take 1 tablet (500 mg total) by mouth 3 (three) times daily. 05/09/16   Joylene John D, NP  Multiple Vitamins-Calcium (ONE-A-DAY WOMENS PO) Take 1 tablet by mouth daily.    [provider]  oxyCODONE-acetaminophen (PERCOCET/ROXICET) 5-325 MG tablet Take 1-2 tablets by mouth every 4 (four) hours as needed (moderate to severe pain (when tolerating fluids)). 03/22/16   Joylene John D, NP  ranitidine (ZANTAC) 150 MG tablet Take 150 mg by mouth daily as needed for heartburn.    [provider]    Family History No family history on file.  Social History Social History   Tobacco Use  . Smoking status: Never Smoker  . Smokeless tobacco: Never Used  Substance Use Topics  .  Alcohol use: No  . Drug use: No     Allergies   Patient has no known allergies.   Review of Systems Review of Systems  Constitutional: Negative for appetite change and fever.  HENT: Negative for congestion, sore throat and voice change.   Respiratory: Positive for cough and shortness of breath.   Cardiovascular: Negative for leg swelling.  Gastrointestinal: Negative for abdominal pain.  Musculoskeletal: Negative for back pain.  Neurological: Negative for weakness.     Physical Exam Updated Vital Signs BP 119/81 (BP Location: Right Arm)   Pulse 77   Temp 97.9 F (36.6 C) (Oral)    Resp 16   Ht 5\' 6"  (1.676 m)   Wt 100.2 kg (221 lb)   SpO2 98%   BMI 35.67 kg/m   Physical Exam  Constitutional: She appears well-developed.  HENT:  Head: Atraumatic.  Mouth/Throat: No oropharyngeal exudate.  Tongue has green spots on it from the recent candy she is eaten  Neck: Neck supple.  Cardiovascular: Normal rate.  Pulmonary/Chest: No respiratory distress. She has no wheezes.  Abdominal: There is no tenderness.  Musculoskeletal: She exhibits no edema.  Neurological: She is alert.  Skin: Skin is warm.     ED Treatments / Results  Labs (all labs ordered are listed, but only abnormal results are displayed) Labs Reviewed - No data to display  EKG  EKG Interpretation None       Radiology Dg Chest 2 View  Result Date: 10/02/2017 CLINICAL DATA:  Nonproductive cough and shortness of breath. EXAM: CHEST  2 VIEW COMPARISON:  None. FINDINGS: The heart size and mediastinal contours are within normal limits. Mild linear scarring noted in both lung bases. No evidence of pulmonary infiltrate or edema. No evidence of pleural effusion. The visualized skeletal structures are unremarkable. IMPRESSION: No active cardiopulmonary disease. Electronically Signed   By: Earle Gell M.D.   On: 10/02/2017 08:15    Procedures Procedures (including critical care time)  Medications Ordered in ED Medications  albuterol (PROVENTIL HFA;VENTOLIN HFA) 108 (90 Base) MCG/ACT inhaler 2 puff (2 puffs Inhalation Given 10/02/17 0748)     Initial Impression / Assessment and Plan / ED Course  I have reviewed the triage vital signs and the nursing notes.  Pertinent labs & imaging results that were available during my care of the patient were reviewed by me and considered in my medical decision making (see chart for details).     Patient with cough.  Has had for the last month.  X-ray reassuring.  No wheezes but given albuterol since she may tighten up with a more acute exposure.  Also given cough  medicine.  Follow-up PCP as needed.  Doubt pulmonary embolism  Final Clinical Impressions(s) / ED Diagnoses   Final diagnoses:  Cough    ED Discharge Orders        Ordered    guaiFENesin-dextromethorphan (ROBITUSSIN DM) 100-10 MG/5ML syrup  3 times daily PRN     10/02/17 0909       Davonna Belling, MD 10/02/17 [phone]

## 2017-10-02 NOTE — ED Triage Notes (Signed)
Patient complaining of a dry cough x 1 month along ith SOB. Reports working at [employer] and is worried it may be from the environment there from dirty vents. Denies any pain. Oxygen saturation 98 %. No respiratory distress noted

## 2017-12-04 ENCOUNTER — Other Ambulatory Visit: Payer: Self-pay | Admitting: Family Medicine

## 2017-12-04 DIAGNOSIS — Z1231 Encounter for screening mammogram for malignant neoplasm of breast: Secondary | ICD-10-CM

## 2017-12-28 ENCOUNTER — Ambulatory Visit
Admission: RE | Admit: 2017-12-28 | Discharge: 2017-12-28 | Disposition: A | Discharge status: Home / Self Care | Payer: 59 | Source: Ambulatory Visit | Attending: Family Medicine | Admitting: Family Medicine

## 2017-12-28 DIAGNOSIS — Z1231 Encounter for screening mammogram for malignant neoplasm of breast: Secondary | ICD-10-CM

## 2018-06-26 ENCOUNTER — Emergency Department (HOSPITAL_COMMUNITY): Payer: 59

## 2018-06-26 ENCOUNTER — Emergency Department (HOSPITAL_COMMUNITY)
Admission: EM | Admit: 2018-06-26 | Discharge: 2018-06-26 | Disposition: A | Discharge status: Home / Self Care | Payer: 59 | Attending: Emergency Medicine | Admitting: Emergency Medicine

## 2018-06-26 ENCOUNTER — Encounter (HOSPITAL_COMMUNITY): Payer: Self-pay | Admitting: Emergency Medicine

## 2018-06-26 ENCOUNTER — Other Ambulatory Visit: Payer: Self-pay

## 2018-06-26 DIAGNOSIS — Z79899 Other long term (current) drug therapy: Secondary | ICD-10-CM | POA: Insufficient documentation

## 2018-06-26 DIAGNOSIS — J069 Acute upper respiratory infection, unspecified: Secondary | ICD-10-CM

## 2018-06-26 DIAGNOSIS — R05 Cough: Secondary | ICD-10-CM | POA: Diagnosis present

## 2018-06-26 MED ORDER — GUAIFENESIN-DM 100-10 MG/5ML PO SYRP: 5.0000 mL | ORAL_SOLUTION | Freq: Three times a day (TID) | ORAL | 0 refills | Status: AC | PRN

## 2018-06-26 MED ORDER — BENZONATATE 100 MG PO CAPS: 100.0000 mg | ORAL_CAPSULE | Freq: Three times a day (TID) | ORAL | 0 refills | Status: AC

## 2018-06-26 NOTE — ED Notes (Signed)
Patient transported to X-ray 

## 2018-06-26 NOTE — ED Provider Notes (Signed)
[facility] EMERGENCY DEPARTMENT Provider Note   CSN: [id number] Arrival date & time: 06/26/18  6546     History   Chief Complaint Chief Complaint  Patient presents with  . Cough  . Nasal Congestion    HPI Ebony Nichols is a 57 y.o. female.  The history is provided by the patient. No language interpreter was used.  Cough      57 year old female with history of GERD presenting for evaluation of a cough.  Patient report for the past 2 weeks she has had a recurrent cough that is sometimes productive with some small amount of sputum.  She also endorsed sinus congestion, occasional sneeze, throat irritation, and chest congestion.  Symptoms more noticeable at nighttime.  She endorsed occasional subjective fever.  She is trying TheraFlu at home without adequate relief.  Her symptom is now persistent.  She is a non-smoker.  She denies any environmental changes.  She has not had a flu shot.  She denies any recent travel.  She denies any significant wheezing.  And no complaints of chest pain or shortness of breath.  Past Medical History:  Diagnosis Date  . Anemia    history  . GERD (gastroesophageal reflux disease)   . Shingles    history  . Sleep apnea    does not use cpap  . SVD (spontaneous vaginal delivery)    x 1    Patient Active Problem List   Diagnosis Date Noted  . 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 Postmenopausal bleeding 01/12/2016  . Endometrial mass 01/12/2016  . 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 DILATATION & CURETTAGE/HYSTEROSCOPY WITH MYOSURE N/A 01/12/2016   Procedure: DILATATION & CURETTAGE/HYSTEROSCOPY WITH MYOSURE POLYPECTOMY;  Surgeon: Christophe Louis, MD;  Location: [facility];  Service: Gynecology;  Laterality: N/A;  . NASAL SINUS SURGERY    . ROBOTIC ASSISTED TOTAL HYSTERECTOMY WITH BILATERAL SALPINGO OOPHERECTOMY Bilateral 03/21/2016   Procedure: XI ROBOTIC ASSISTED  TOTAL HYSTERECTOMY WITH BILATERAL SALPINGO OOPHORECTOMY;  Surgeon: Nancy Marus, MD;  Location: [facility];  Service: Gynecology;  Laterality: Bilateral;  . TUBAL LIGATION    . WISDOM TOOTH EXTRACTION       OB History   None      Home Medications    Prior to Admission medications   Medication Sig Start Date End Date Taking? Authorizing Provider  amoxicillin-clavulanate (AUGMENTIN) 875-125 MG tablet Take 1 tablet by mouth every 12 (twelve) hours. 07/21/16   Rancour, Annie Main, MD  benzonatate (TESSALON) 100 MG capsule Take 1 capsule (100 mg total) by mouth every 8 (eight) hours. 08/21/16   Deno Etienne, DO  guaiFENesin-dextromethorphan (ROBITUSSIN DM) 100-10 MG/5ML syrup Take 5 mLs by mouth 3 (three) times daily as needed for cough. 10/02/17   Davonna Belling, MD  ibuprofen (ADVIL,MOTRIN) 600 MG tablet 1 po every 6 hours as needed for 01/12/16   Christophe Louis, MD  metroNIDAZOLE (FLAGYL) 500 MG tablet Take 1 tablet (500 mg total) by mouth 3 (three) times daily. 05/09/16   Joylene John D, NP  Multiple Vitamins-Calcium (ONE-A-DAY WOMENS PO) Take 1 tablet by mouth daily.    [provider]  oxyCODONE-acetaminophen (PERCOCET/ROXICET) 5-325 MG tablet Take 1-2 tablets by mouth every 4 (four) hours as needed (moderate to severe pain (when tolerating fluids)). 03/22/16   Joylene John D, NP  ranitidine (ZANTAC) 150 MG tablet Take 150 mg by mouth daily as needed for heartburn.    [provider]    Family History No family history on file.  Social History Social History   Tobacco Use  . Smoking status: Never Smoker  . Smokeless tobacco: Never Used  Substance Use Topics  . Alcohol use: No  . Drug use: No     Allergies   Patient has no known allergies.   Review of Systems Review of Systems  Respiratory: Positive for cough.   All other systems reviewed and are negative.    Physical Exam Updated Vital Signs BP (!) 124/93 (BP Location: Right Arm)   Pulse 83   Temp 98.1 F  (36.7 C) (Oral)   Resp 16   Ht 5\' 6"  (1.676 m)   Wt 9.072 kg   SpO2 97%   BMI 3.23 kg/m   Physical Exam  Constitutional: She appears well-developed and well-nourished. No distress.  HENT:  Head: Atraumatic.  Right Ear: External ear normal.  Left Ear: External ear normal.  Nose: Nose normal.  Mouth/Throat: Oropharynx is clear and moist.  Eyes: Conjunctivae are normal.  Neck: Normal range of motion. Neck supple.  Cardiovascular: Normal rate and regular rhythm.  Pulmonary/Chest: Effort normal and breath sounds normal. She has no rales.  Abdominal: Soft. There is no tenderness.  Musculoskeletal: She exhibits no edema.  Lymphadenopathy:    She has no cervical adenopathy.  Neurological: She is alert.  Skin: No rash noted.  Psychiatric: She has a normal mood and affect.  Nursing note and vitals reviewed.    ED Treatments / Results  Labs (all labs ordered are listed, but only abnormal results are displayed) Labs Reviewed - No data to display  EKG None  Radiology Dg Chest 2 View  Result Date: 06/26/2018 CLINICAL DATA:  Productive cough, fever. EXAM: CHEST - 2 VIEW COMPARISON:  Radiographs of October 02, 2017. FINDINGS: The heart size and mediastinal contours are within normal limits. Both lungs are clear. The visualized skeletal structures are unremarkable. IMPRESSION: No active cardiopulmonary disease. Electronically Signed   By: Marijo Conception, M.D.   On: 06/26/2018 08:33    Procedures Procedures (including critical care time)  Medications Ordered in ED Medications - No data to display   Initial Impression / Assessment and Plan / ED Course  I have reviewed the triage vital signs and the nursing notes.  Pertinent labs & imaging results that were available during my care of the patient were reviewed by me and considered in my medical decision making (see chart for details).     BP (!) 124/93 (BP Location: Right Arm)   Pulse 83   Temp 98.1 F (36.7 C) (Oral)    Resp 16   Ht 5\' 6"  (1.676 m)   Wt 9.072 kg   SpO2 97%   BMI 3.23 kg/m    Final Clinical Impressions(s) / ED Diagnoses   Final diagnoses:  Acute upper respiratory infection    ED Discharge Orders         Ordered    benzonatate (TESSALON) 100 MG capsule  Every 8 hours     06/26/18 0838    guaiFENesin-dextromethorphan (ROBITUSSIN DM) 100-10 MG/5ML syrup  3 times daily PRN     06/26/18 0838         Pt symptoms consistent with URI. CXR negative for acute infiltrate. Pt will be discharged with symptomatic treatment.  Discussed return precautions.  Pt is hemodynamically stable & in NAD prior to discharge.    Domenic Moras, PA-C 06/26/18 2440    Long, Wonda Olds,  MD 06/26/18 1818

## 2018-06-26 NOTE — ED Triage Notes (Signed)
Pt. Stated, Donnald Garre had a cough for 2 weeks, I take There-flu and nothing is working.

## 2018-11-20 ENCOUNTER — Other Ambulatory Visit: Payer: Self-pay | Admitting: Family Medicine

## 2018-11-20 DIAGNOSIS — Z1231 Encounter for screening mammogram for malignant neoplasm of breast: Secondary | ICD-10-CM

## 2019-01-10 ENCOUNTER — Encounter (HOSPITAL_COMMUNITY): Payer: Self-pay | Admitting: Emergency Medicine

## 2019-01-10 ENCOUNTER — Emergency Department (HOSPITAL_COMMUNITY)
Admission: EM | Admit: 2019-01-10 | Discharge: 2019-01-10 | Disposition: A | Discharge status: Home / Self Care | Payer: 59 | Attending: Emergency Medicine | Admitting: Emergency Medicine

## 2019-01-10 ENCOUNTER — Other Ambulatory Visit: Payer: Self-pay

## 2019-01-10 DIAGNOSIS — R21 Rash and other nonspecific skin eruption: Secondary | ICD-10-CM | POA: Insufficient documentation

## 2019-01-10 DIAGNOSIS — Z79899 Other long term (current) drug therapy: Secondary | ICD-10-CM | POA: Insufficient documentation

## 2019-01-10 MED ORDER — TRIAMCINOLONE ACETONIDE 0.1 % EX CREA: 1.0000 "application " | TOPICAL_CREAM | Freq: Two times a day (BID) | CUTANEOUS | 0 refills | Status: AC

## 2019-01-10 MED ORDER — DEXAMETHASONE 4 MG PO TABS
8.0000 mg | ORAL_TABLET | Freq: Once | ORAL | Status: AC
  Administered 2019-01-10: 8 mg via ORAL
  Filled 2019-01-10: qty 2

## 2019-01-10 NOTE — Discharge Instructions (Signed)
Please read instructions below. You can take Benadryl as needed every 6 hours for itching.  You can take pepcid every 12 hours in addition to this for added relief. Apply a thing layer of the steroid cream as prescribed. Avoid scratching as much as possible. Follow up with your primary care provider if rash doesn't improve in the next few days. Return to the ER for fever, or new or worsening symptoms.

## 2019-01-10 NOTE — ED Triage Notes (Signed)
Pt in with c/o L neck/face shingles rash. States she has been stressed recently and the rash began the past 24hrs. Blister forming on L lip

## 2019-01-10 NOTE — ED Notes (Signed)
Pt reports a shingles outbreak due to stress. Same started on Monday 01/06/2019.

## 2019-01-10 NOTE — ED Notes (Signed)
Patient verbalizes understanding of discharge instructions. Opportunity for questioning and answers were provided. Armband removed by staff, pt discharged from ED home via POV.  

## 2019-01-10 NOTE — ED Provider Notes (Signed)
[facility] EMERGENCY DEPARTMENT Provider Note   CSN: [id number] Arrival date & time: 01/10/19  0747    History   Chief Complaint Chief Complaint  Patient presents with  . Rash    HPI Ebony Nichols is a 58 y.o. female with past medical history of herpes zoster, GERD, presenting to the emergency department with complaint of an itchy rash to the left side of her neck since Monday.  Patient states she used a soap with a new fragrance and shortly after the rash began.  She states it is itchy, it is not painful.  She has been up taking Benadryl and applying wet washcloths for her symptoms without significant relief.  No fevers or chills.  Denies blistering.  She states she has had zoster in the past, however it was painful and had blisters.  She had a zoster vaccine 1 year ago.  No swelling of the lips or tongue.  No history of diabetes or immunocompromise.  Patient feels well otherwise.     The history is provided by the patient.    Past Medical History:  Diagnosis Date  . Anemia    history  . GERD (gastroesophageal reflux disease)   . Shingles    history  . Sleep apnea    does not use cpap  . SVD (spontaneous vaginal delivery)    x 1    Patient Active Problem List   Diagnosis Date Noted  . 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 Postmenopausal bleeding 01/12/2016  . Endometrial mass 01/12/2016  . 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 DILATATION & CURETTAGE/HYSTEROSCOPY WITH MYOSURE N/A 01/12/2016   Procedure: DILATATION & CURETTAGE/HYSTEROSCOPY WITH MYOSURE POLYPECTOMY;  Surgeon: Christophe Louis, MD;  Location: [facility];  Service: Gynecology;  Laterality: N/A;  . NASAL SINUS SURGERY    . ROBOTIC ASSISTED TOTAL HYSTERECTOMY WITH BILATERAL SALPINGO OOPHERECTOMY Bilateral 03/21/2016   Procedure: XI ROBOTIC ASSISTED TOTAL HYSTERECTOMY WITH BILATERAL SALPINGO OOPHORECTOMY;  Surgeon: Nancy Marus, MD;   Location: [facility];  Service: Gynecology;  Laterality: Bilateral;  . TUBAL LIGATION    . WISDOM TOOTH EXTRACTION       OB History   No obstetric history on file.      Home Medications    Prior to Admission medications   Medication Sig Start Date End Date Taking? Authorizing Provider  amoxicillin-clavulanate (AUGMENTIN) 875-125 MG tablet Take 1 tablet by mouth every 12 (twelve) hours. 07/21/16   Rancour, Annie Main, MD  benzonatate (TESSALON) 100 MG capsule Take 1 capsule (100 mg total) by mouth every 8 (eight) hours. 06/26/18   Domenic Moras, PA-C  guaiFENesin-dextromethorphan (ROBITUSSIN DM) 100-10 MG/5ML syrup Take 5 mLs by mouth 3 (three) times daily as needed for cough. 06/26/18   Domenic Moras, PA-C  ibuprofen (ADVIL,MOTRIN) 600 MG tablet 1 po every 6 hours as needed for 01/12/16   Christophe Louis, MD  metroNIDAZOLE (FLAGYL) 500 MG tablet Take 1 tablet (500 mg total) by mouth 3 (three) times daily. 05/09/16   Joylene John D, NP  Multiple Vitamins-Calcium (ONE-A-DAY WOMENS PO) Take 1 tablet by mouth daily.    [provider]  oxyCODONE-acetaminophen (PERCOCET/ROXICET) 5-325 MG tablet Take 1-2 tablets by mouth every 4 (four) hours as needed (moderate to severe pain (when tolerating fluids)). 03/22/16   Joylene John D, NP  ranitidine (ZANTAC) 150 MG tablet Take 150 mg by mouth daily as needed for heartburn.    [provider]  triamcinolone cream (KENALOG) 0.1 % Apply 1 application topically 2 (two) times daily for 7 days. 01/10/19 01/17/19  Drevion Offord, [place] N, PA-C    Family History No family history on file.  Social History Social History   Tobacco Use  . Smoking status: Never Smoker  . Smokeless tobacco: Never Used  Substance Use Topics  . Alcohol use: No  . Drug use: No     Allergies   Patient has no known allergies.   Review of Systems Review of Systems  Constitutional: Negative for fever.  HENT: Negative for facial swelling.   Skin: Positive for rash.   Allergic/Immunologic: Negative for immunocompromised state.     Physical Exam Updated Vital Signs BP (!) 108/91 (BP Location: Right Arm)   Pulse 86   Temp 98.9 F (37.2 C) (Oral)   Resp 16   Wt 86.2 kg   SpO2 98%   BMI 30.67 kg/m   Physical Exam Vitals signs and nursing note reviewed.  Constitutional:      General: She is not in acute distress.    Appearance: She is well-developed. She is not ill-appearing.  HENT:     Head: Normocephalic and atraumatic.  Eyes:     Conjunctiva/sclera: Conjunctivae normal.  Cardiovascular:     Rate and Rhythm: Normal rate.  Pulmonary:     Effort: Pulmonary effort is normal. No respiratory distress.     Breath sounds: No stridor.  Skin:    Comments: There are erythematous papules to the left lateral and anterior neck.  This rash crosses the midline anteriorly and does not follow a specific dermatome.  It spans from the mandible all the way down to below the clavicle.  There is no rash to the posterior neck.  There is also a few papules to the left face above the upper lip.  No vesicles present.  No other involvement of the face.  No ocular involvement.  Rash is nontender.  There is overlying excoriation.  No vesicles or blistering.  No purulence.  No petechia.  No bulla or desquamation.   See images below.  Neurological:     Mental Status: She is alert.  Psychiatric:        Mood and Affect: Mood normal.        Behavior: Behavior normal.          ED Treatments / Results  Labs (all labs ordered are listed, but only abnormal results are displayed) Labs Reviewed - No data to display  EKG None  Radiology No results found.  Procedures Procedures (including critical care time)  Medications Ordered in ED Medications  dexamethasone (DECADRON) tablet 8 mg (has no administration in time range)     Initial Impression / Assessment and Plan / ED Course  I have reviewed the triage vital signs and the nursing notes.  Pertinent labs  & imaging results that were available during my care of the patient were reviewed by me and considered in my medical decision making (see chart for details).        Rash consistent with contact dermatitis after using a new soap. Patient denies any difficulty breathing or swallowing.  Pt has a patent airway without stridor and is handling secretions without difficulty; no angioedema. No blisters, no pustules, no warmth, no draining sinus tracts, no superficial abscesses, no bullous impetigo, no vesicles, no desquamation, no target lesions with dusky purpura or a central bulla. Not tender to touch. No concern for superimposed infection.  Exam is not consistent  with herpes zoster, does not follow a dermatome, it crosses the midline, it is not painful, there are no vesicles, and pt reports it does not feel similar to prior episodes of zoster.  Patient treated in the ED with a dose of Decadron.  Will discharge with instructions to take antihistamines and topical steroids as needed for pruritis.  Close follow-up.  Return for fever or worsening symptoms.  Safe for discharge.   Discussed results, findings, treatment and follow up. Patient advised of return precautions. Patient verbalized understanding and agreed with plan.  Final Clinical Impressions(s) / ED Diagnoses   Final diagnoses:  Rash    ED Discharge Orders         Ordered    triamcinolone cream (KENALOG) 0.1 %  2 times daily     01/10/19 0843           Montoya Brandel, [place] N, PA-C 01/10/19 0901    Pattricia Boss, MD 01/10/19 [phone]

## 2019-01-17 ENCOUNTER — Ambulatory Visit: Payer: Self-pay

## 2019-02-01 ENCOUNTER — Other Ambulatory Visit: Payer: Self-pay

## 2019-02-01 ENCOUNTER — Ambulatory Visit
Admission: RE | Admit: 2019-02-01 | Discharge: 2019-02-01 | Disposition: A | Discharge status: Home / Self Care | Payer: 59 | Source: Ambulatory Visit | Attending: Family Medicine | Admitting: Family Medicine

## 2019-02-01 DIAGNOSIS — Z1231 Encounter for screening mammogram for malignant neoplasm of breast: Secondary | ICD-10-CM

## 2019-12-26 IMAGING — MG DIGITAL SCREENING BILATERAL MAMMOGRAM WITH CAD
5 series · 5 of 5 positions shown · non-contrast
Comparison: Previous exam(s).

CLINICAL DATA: Screening.

EXAM:
DIGITAL SCREENING BILATERAL MAMMOGRAM WITH CAD

[R CC (1 of 2)]
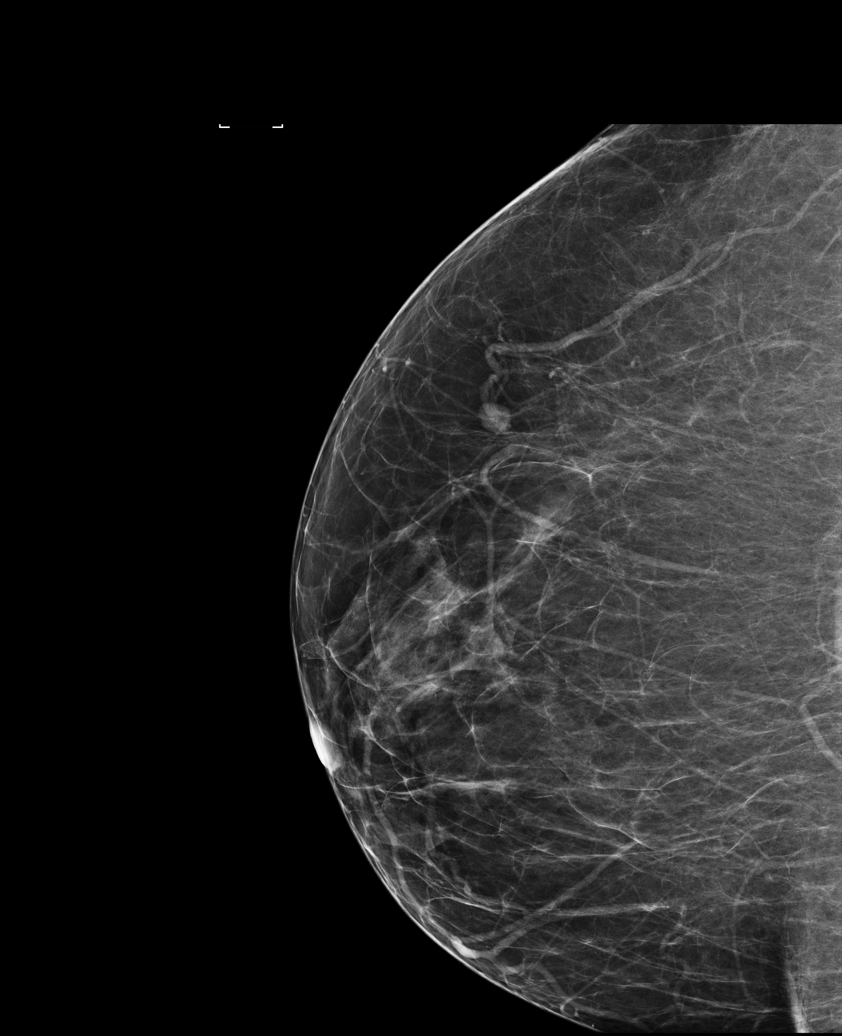

[L MLO]
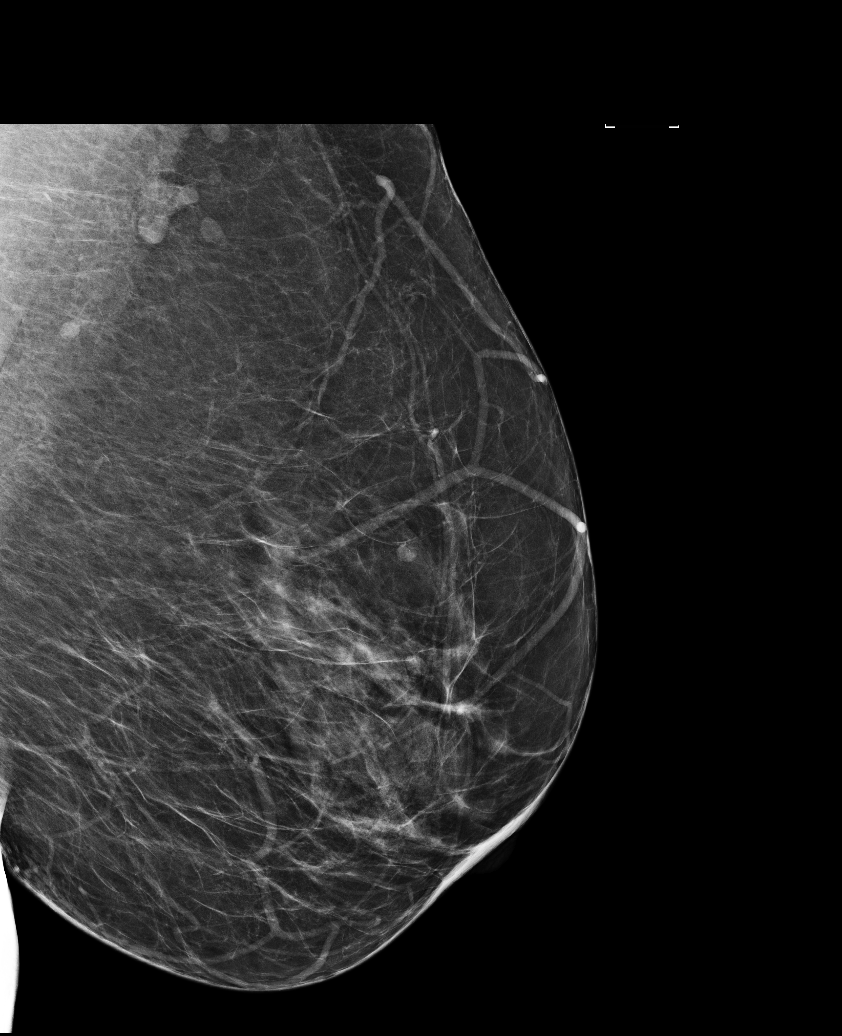

[R CC (2 of 2)]
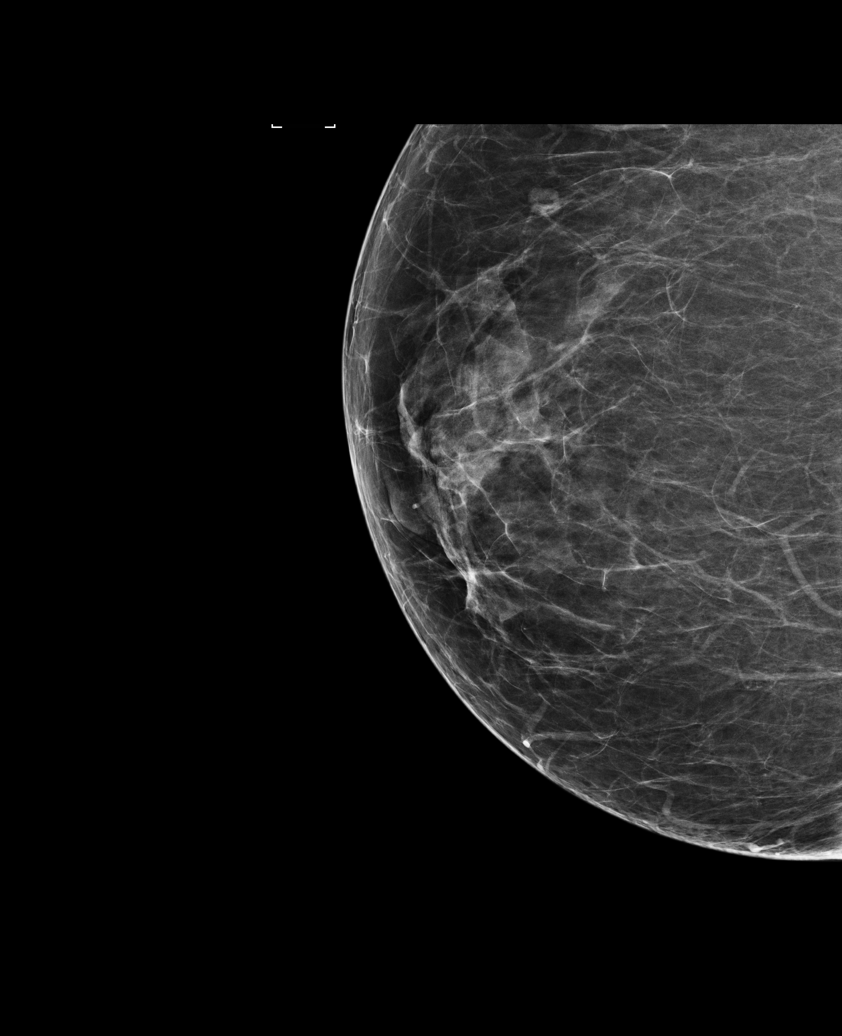

[R MLO]
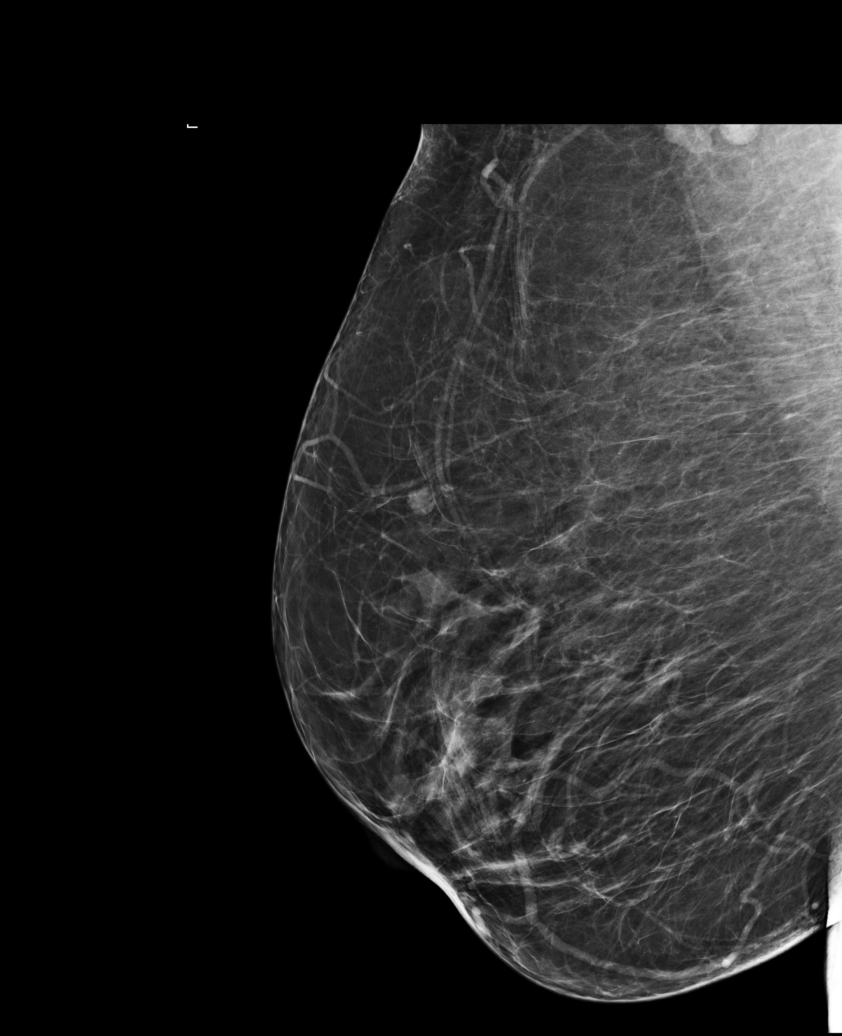

[L CC]
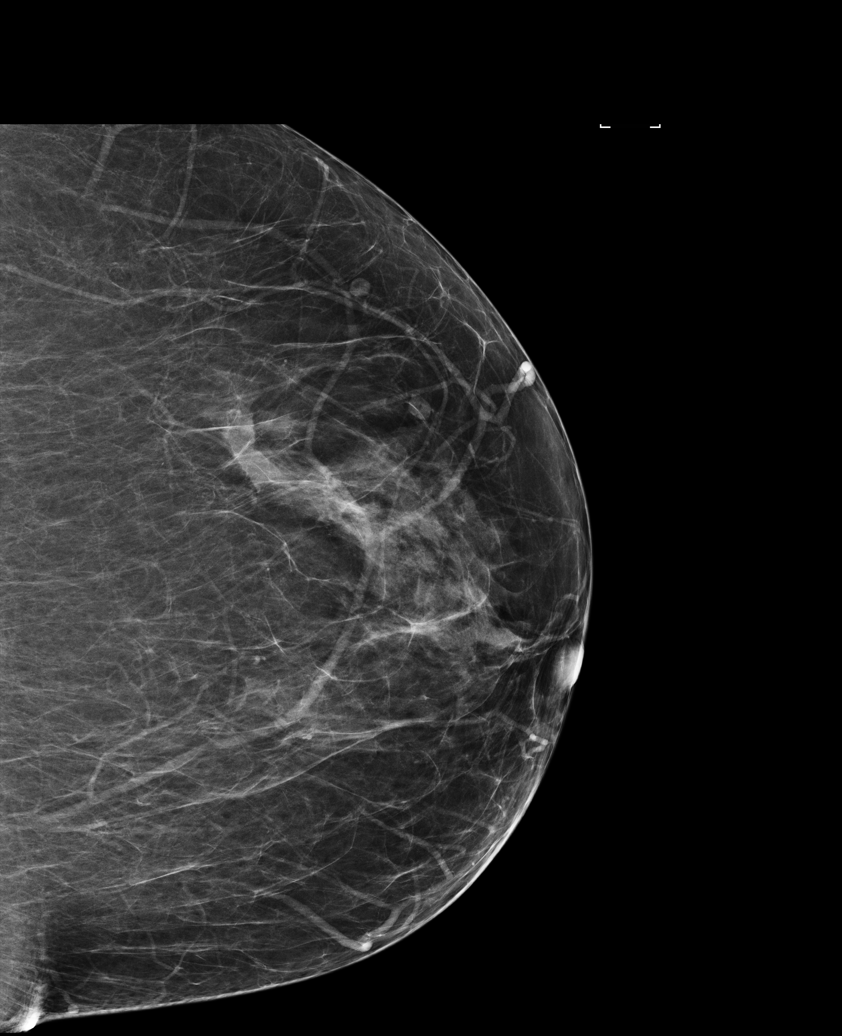

[5 of 5 positions shown; findings below may reference images not displayed]

ACR Breast Density Category b: There are scattered areas of
fibroglandular density.
FINDINGS: There are no findings suspicious for malignancy. Images were
processed with CAD.
IMPRESSION: No mammographic evidence of malignancy. A result letter of this
screening mammogram will be mailed directly to the patient.

RECOMMENDATION:
Screening mammogram in one year. (Code:AS-G-LCT)

BI-RADS CATEGORY  1: Negative.

## 2019-12-30 ENCOUNTER — Other Ambulatory Visit: Payer: Self-pay | Admitting: Physician Assistant

## 2019-12-30 ENCOUNTER — Other Ambulatory Visit: Payer: Self-pay | Admitting: Family Medicine

## 2019-12-30 DIAGNOSIS — Z1231 Encounter for screening mammogram for malignant neoplasm of breast: Secondary | ICD-10-CM

## 2020-02-03 ENCOUNTER — Other Ambulatory Visit: Payer: Self-pay

## 2020-02-03 ENCOUNTER — Ambulatory Visit
Admission: RE | Admit: 2020-02-03 | Discharge: 2020-02-03 | Disposition: A | Discharge status: Home / Self Care | Payer: 59 | Source: Ambulatory Visit | Attending: Physician Assistant | Admitting: Physician Assistant

## 2020-02-03 DIAGNOSIS — Z1231 Encounter for screening mammogram for malignant neoplasm of breast: Secondary | ICD-10-CM

## 2020-06-23 IMAGING — CR DG CHEST 2V
2 series · 2 of 2 positions shown · non-contrast
Comparison: Radiographs October 02, 2017.

CLINICAL DATA: Productive cough, fever.

EXAM:
CHEST - 2 VIEW

[chest pa]
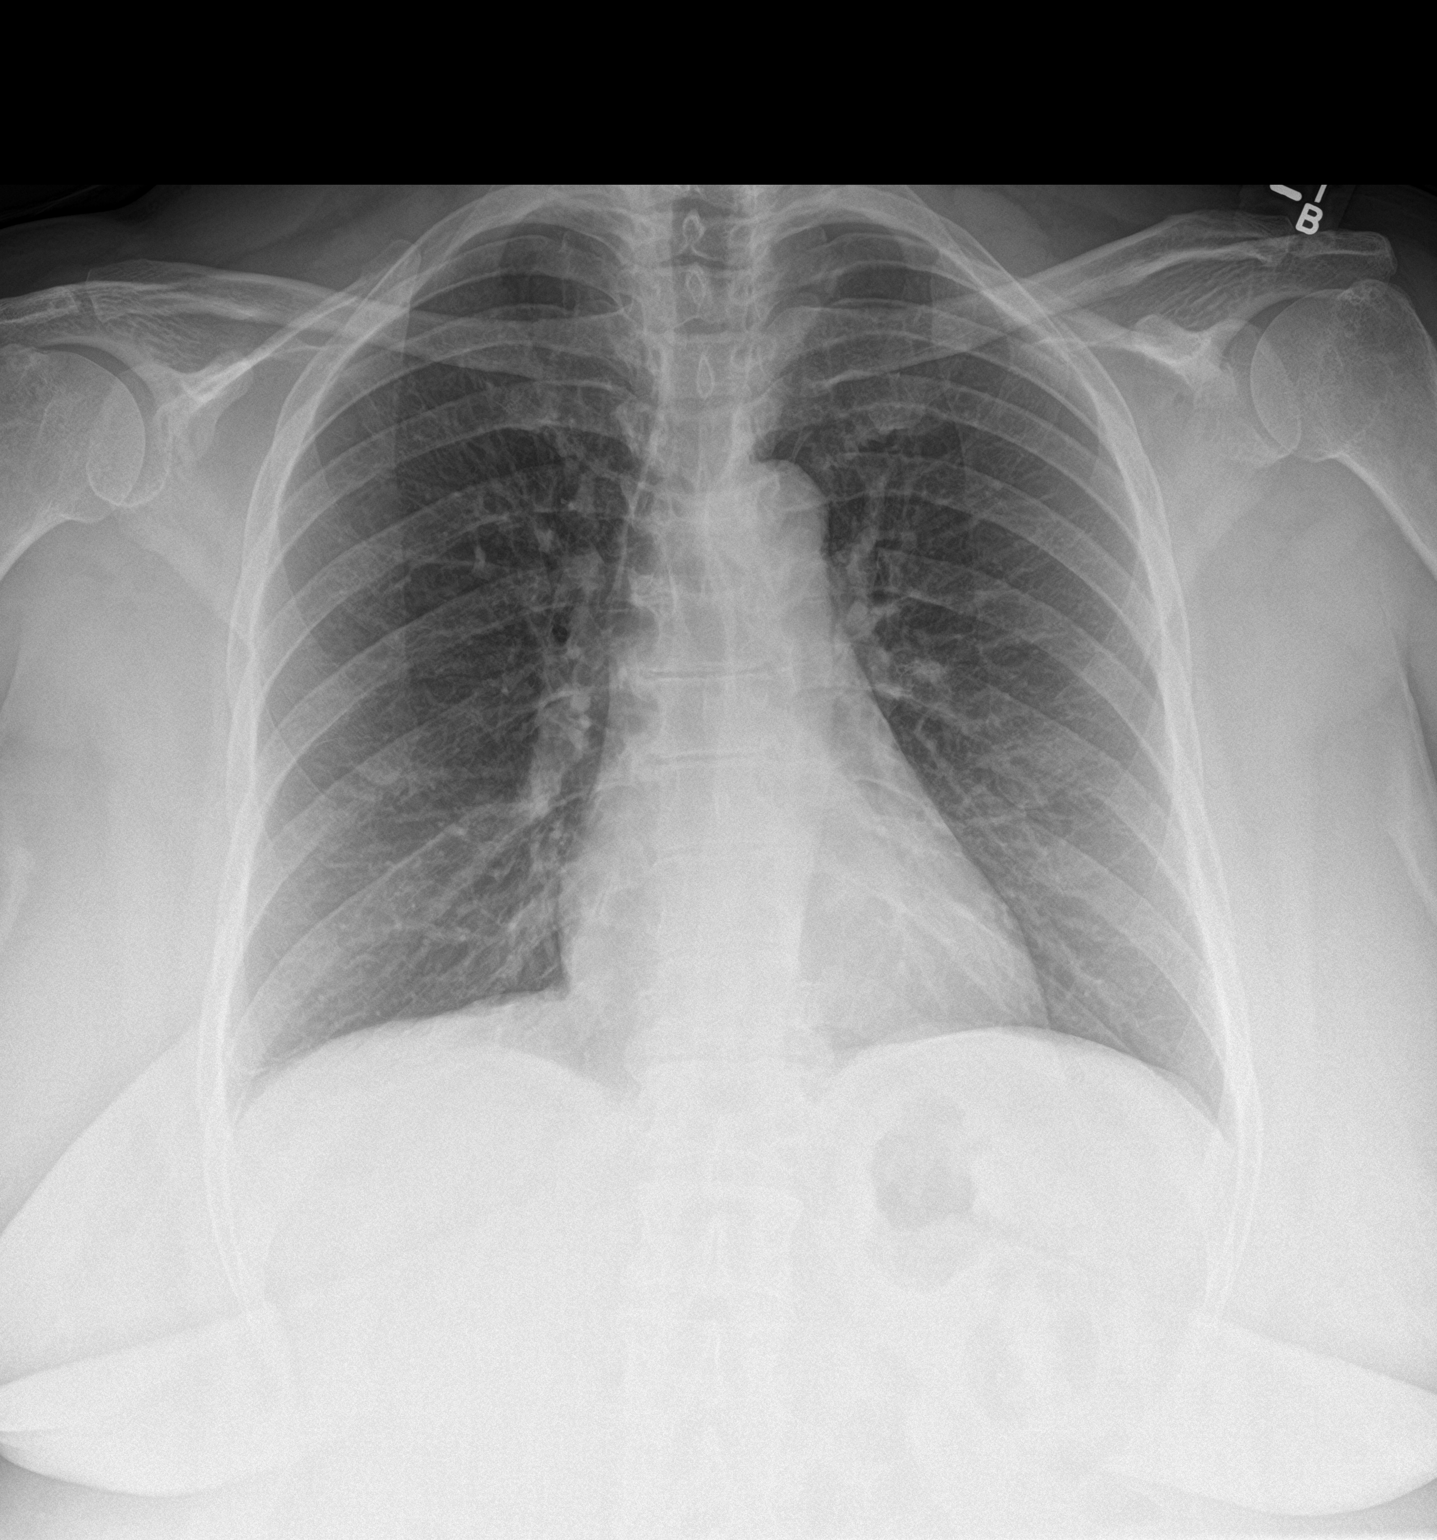

[chest lat]
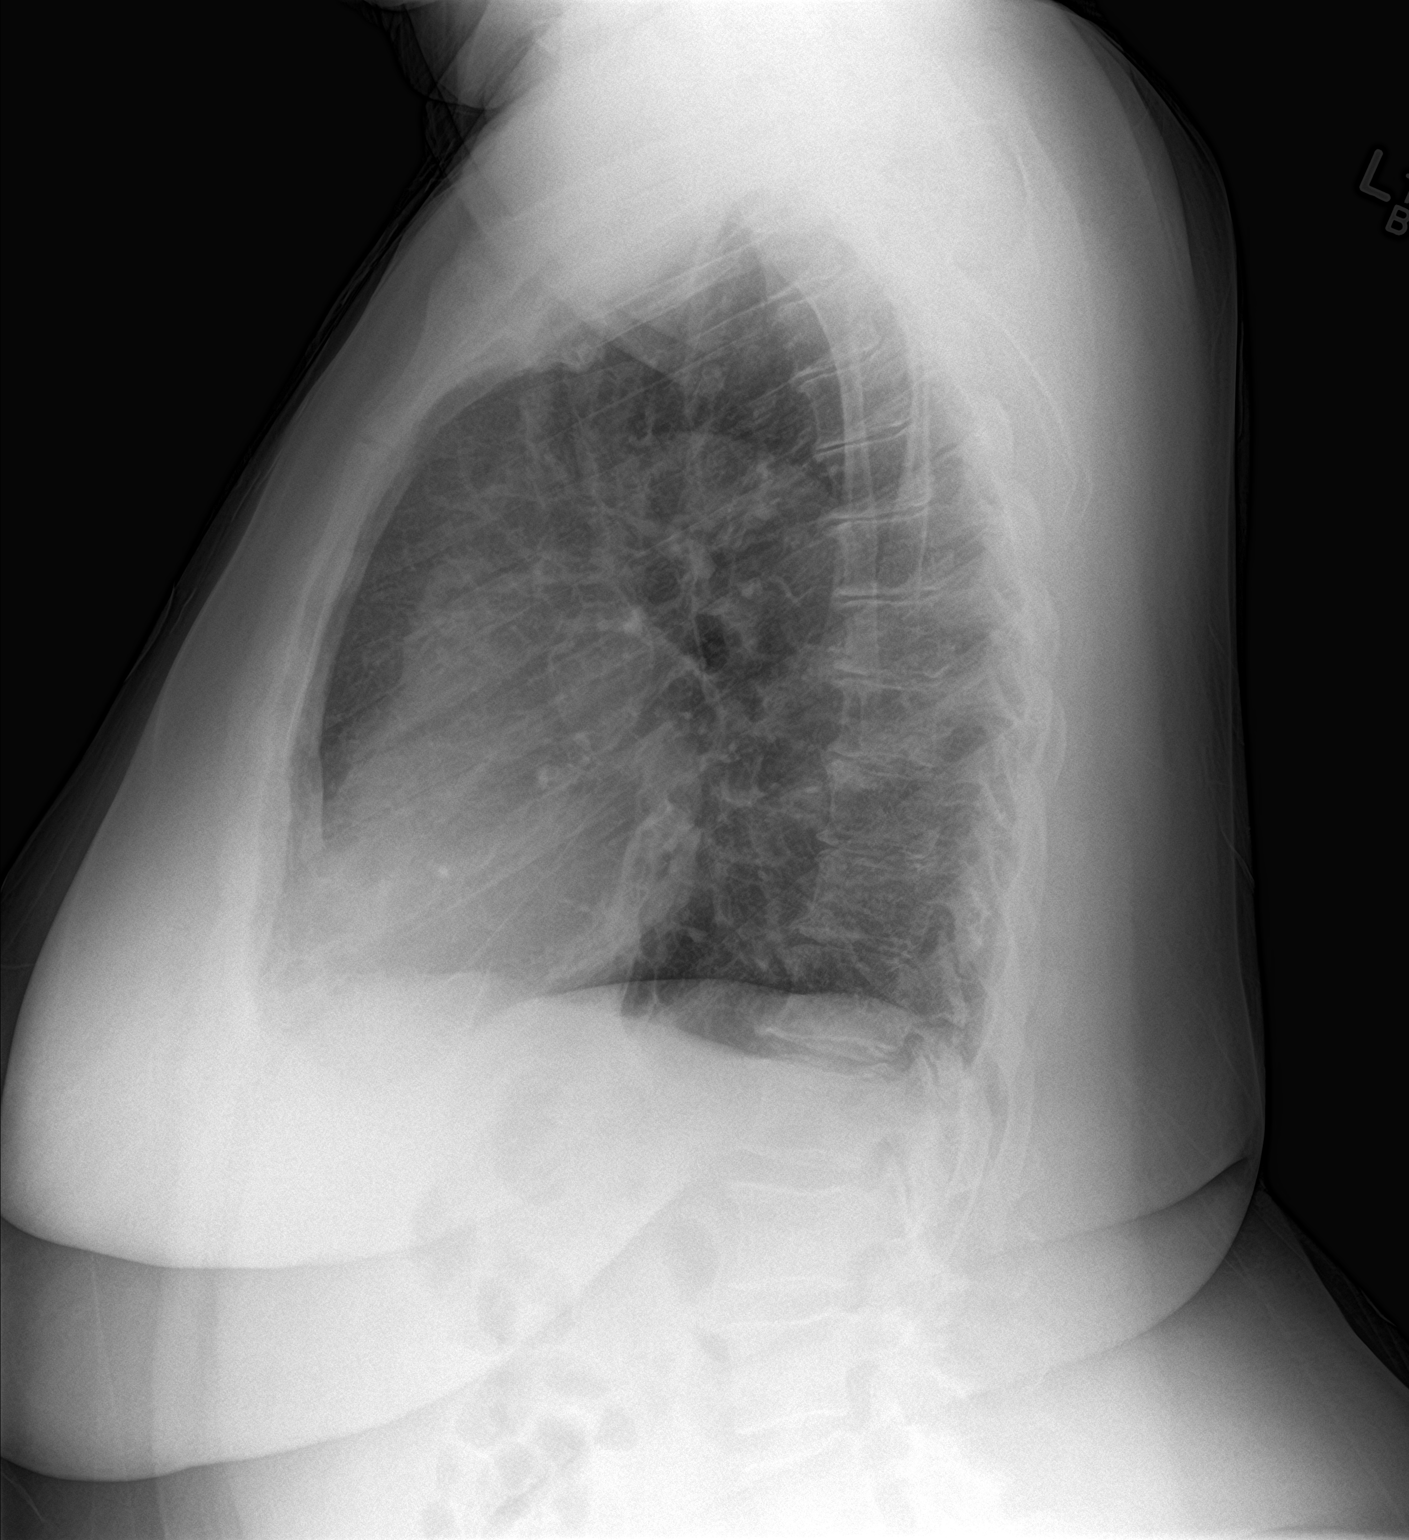

[2 of 2 positions shown; findings below may reference images not displayed]

FINDINGS: The heart size and mediastinal contours are within normal limits.
Both lungs are clear. The visualized skeletal structures are
unremarkable.
IMPRESSION: No active cardiopulmonary disease.
